# Patient Record
Sex: Male | Born: 1945
Health system: Southern US, Community
[De-identification: ages and names within clinical notes are randomized; demographics above are authoritative.]

## PROBLEM LIST (undated history)

## (undated) DIAGNOSIS — I10 Essential (primary) hypertension: Secondary | ICD-10-CM

## (undated) DIAGNOSIS — I7409 Other arterial embolism and thrombosis of abdominal aorta: Secondary | ICD-10-CM

## (undated) DIAGNOSIS — I739 Peripheral vascular disease, unspecified: Secondary | ICD-10-CM

## (undated) DIAGNOSIS — N179 Acute kidney failure, unspecified: Secondary | ICD-10-CM

## (undated) DIAGNOSIS — E039 Hypothyroidism, unspecified: Secondary | ICD-10-CM

## (undated) DIAGNOSIS — K315 Obstruction of duodenum: Secondary | ICD-10-CM

## (undated) DIAGNOSIS — K449 Diaphragmatic hernia without obstruction or gangrene: Secondary | ICD-10-CM

## (undated) DIAGNOSIS — K259 Gastric ulcer, unspecified as acute or chronic, without hemorrhage or perforation: Secondary | ICD-10-CM

## (undated) DIAGNOSIS — R0989 Other specified symptoms and signs involving the circulatory and respiratory systems: Secondary | ICD-10-CM

## (undated) DIAGNOSIS — K219 Gastro-esophageal reflux disease without esophagitis: Secondary | ICD-10-CM

## (undated) HISTORY — DX: Acute kidney failure, unspecified: N17.9

## (undated) HISTORY — DX: Gastro-esophageal reflux disease without esophagitis: K21.9

## (undated) HISTORY — DX: Other specified symptoms and signs involving the circulatory and respiratory systems: R09.89

## (undated) HISTORY — DX: Obstruction of duodenum: K31.5

## (undated) HISTORY — DX: Diaphragmatic hernia without obstruction or gangrene: K44.9

## (undated) HISTORY — DX: Hypothyroidism, unspecified: E03.9

## (undated) HISTORY — DX: Essential (primary) hypertension: I10

## (undated) HISTORY — DX: Gastric ulcer, unspecified as acute or chronic, without hemorrhage or perforation: K25.9

## (undated) HISTORY — DX: Peripheral vascular disease, unspecified: I73.9

## (undated) HISTORY — DX: Other arterial embolism and thrombosis of abdominal aorta: I74.09

## (undated) HISTORY — PX: LUMBAR FUSION: SHX111

---

## 2001-01-16 ENCOUNTER — Encounter: Payer: Self-pay | Admitting: *Deleted

## 2001-01-16 ENCOUNTER — Ambulatory Visit (HOSPITAL_COMMUNITY): Admission: RE | Admit: 2001-01-16 | Discharge: 2001-01-16 | Payer: Self-pay | Admitting: *Deleted

## 2002-08-13 ENCOUNTER — Ambulatory Visit: Admission: RE | Admit: 2002-08-13 | Discharge: 2002-08-13 | Payer: Self-pay | Admitting: *Deleted

## 2002-12-24 ENCOUNTER — Encounter: Payer: Self-pay | Admitting: *Deleted

## 2002-12-24 ENCOUNTER — Encounter: Admission: RE | Admit: 2002-12-24 | Discharge: 2002-12-24 | Payer: Self-pay | Admitting: *Deleted

## 2003-12-10 ENCOUNTER — Ambulatory Visit (HOSPITAL_COMMUNITY): Admission: RE | Admit: 2003-12-10 | Discharge: 2003-12-10 | Payer: Self-pay | Admitting: Neurosurgery

## 2004-02-19 ENCOUNTER — Encounter: Admission: RE | Admit: 2004-02-19 | Discharge: 2004-02-19 | Payer: Self-pay | Admitting: Neurosurgery

## 2004-03-11 ENCOUNTER — Encounter: Admission: RE | Admit: 2004-03-11 | Discharge: 2004-03-11 | Payer: Self-pay | Admitting: Neurosurgery

## 2004-03-28 IMAGING — RF DG MYELOGRAM LUMBAR
11 series · 11 of 11 positions shown · IV contrast (omnipaque)
Comparison: none

CLINICAL DATA: Back pain.  Bilateral hip and leg pain.  Spinal stenosis. 
 LUMBAR MYELOGRAM 
 Lumbar puncture and injection of Omnipaque was performed by Dr. Chai at the L5-S1 level.
 There is mild anterior slip of L-4 on L-5 of approximately 3 mm.  Disc spaces are well maintained.  There is no significant spinal stenosis.  There is no significant spinal stenosis.  There is no nerve root cut off.  There is mild disc bulge at L2-3, L3-4, and L4-5. 
 IMPRESSION
 Grade I anterior slip of L-4 on L-5 without significant spinal stenosis. 
 CT MYELOGRAM LUMBAR SPINE 
 Scanning was performed from L-2 through S-1 with multiplanar reconstruction. 
 L2-3:  There is mild disc bulging without stenosis. 
 L3-4:  There is mild disc bulging without significant spinal stenosis. 
 L4-5:   Grade I anterior slip is noted.  The disc bulges diffusely and there is a focal small disc protrusion laterally on the right involving the foramen and extraforaminal area.  This may involve the L-4 and L-5 nerve roots.  
 L5-S1:  Mild disc bulging without protrusion or stenosis. 
 Grade I anterior slip of L-4 on L-5.  There is disc bulging and facet arthropathy at L4-5.  There is a lateral disc protrusion on the right at L4-5.
 MULTIPLANAR RECONSTRUCTION 
 Multiplanar reformatted CT images were reconstructed from the axial CT data set.  These images were reviewed, and pertinent findings are included in the accompanying complete CT report.
 See complete CT report.

[Series 1: run · 1 of 1 slices shown (1 of 11)]
[im 1/1]
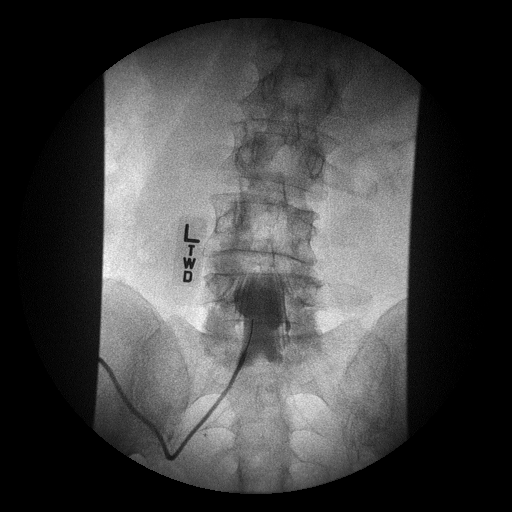

[Series 2: run · 1 of 1 slices shown (2 of 11)]
[im 1/1]
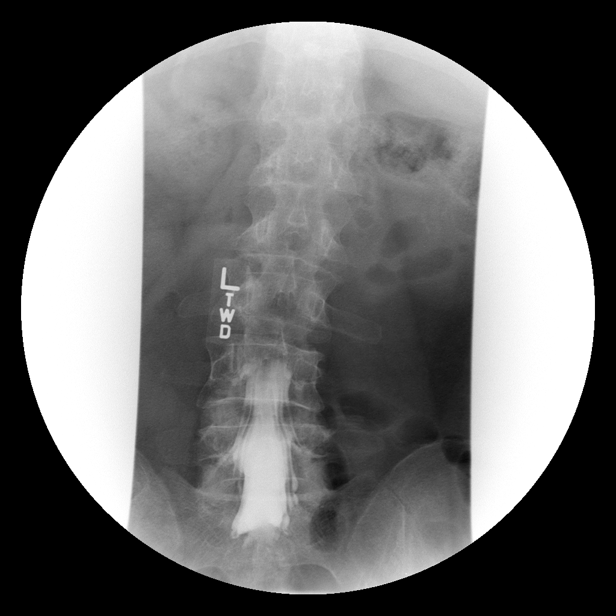

[Series 3: run · 1 of 1 slices shown (3 of 11)]
[im 1/1]
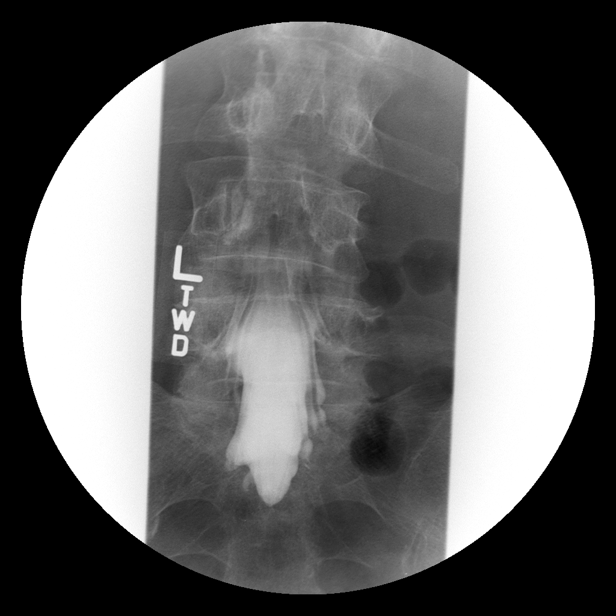

[Series 4: run · 1 of 1 slices shown (4 of 11)]
[im 1/1]
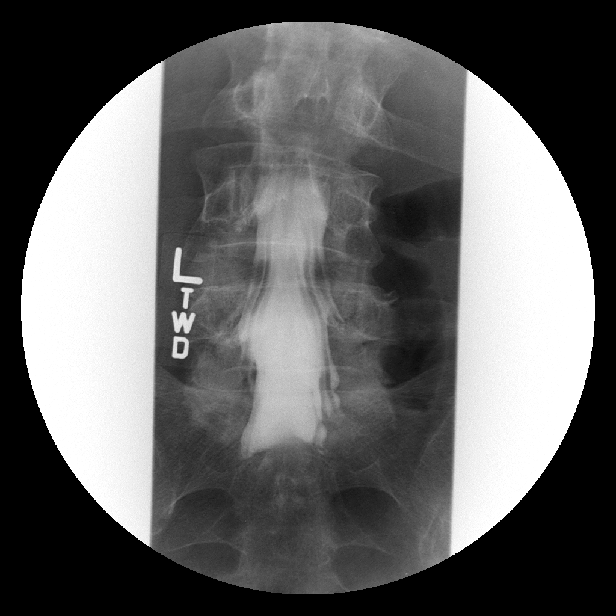

[Series 5: run · 1 of 1 slices shown (5 of 11)]
[im 1/1]
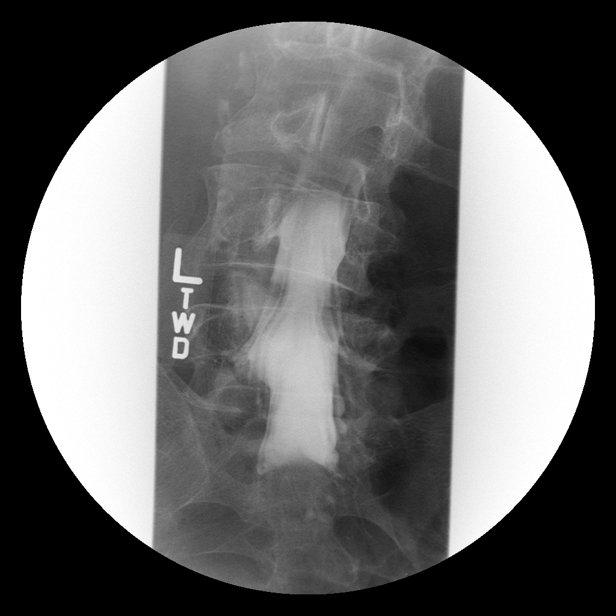

[Series 6: run · 1 of 1 slices shown (6 of 11)]
[im 1/1]
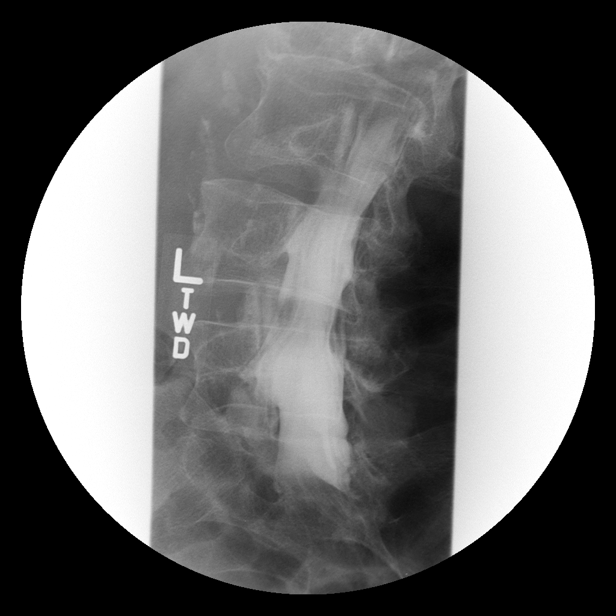

[Series 7: run · 1 of 1 slices shown (7 of 11)]
[im 1/1]
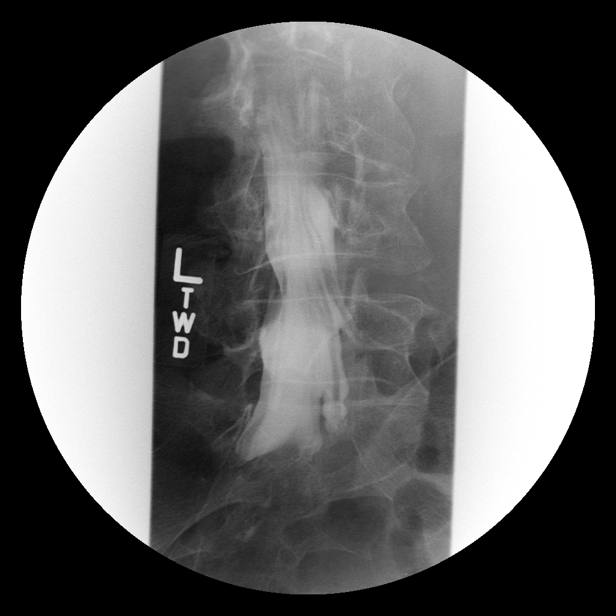

[Series 8: run · 1 of 1 slices shown (8 of 11)]
[im 1/1]
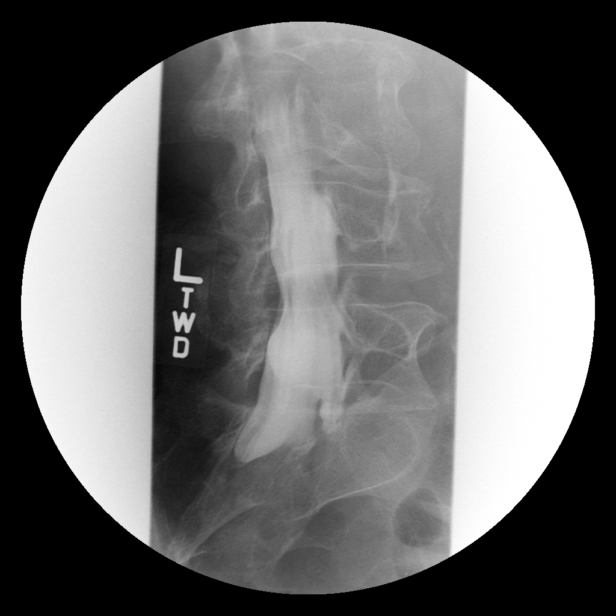

[Series 9: run · 1 of 1 slices shown (9 of 11)]
[im 1/1]
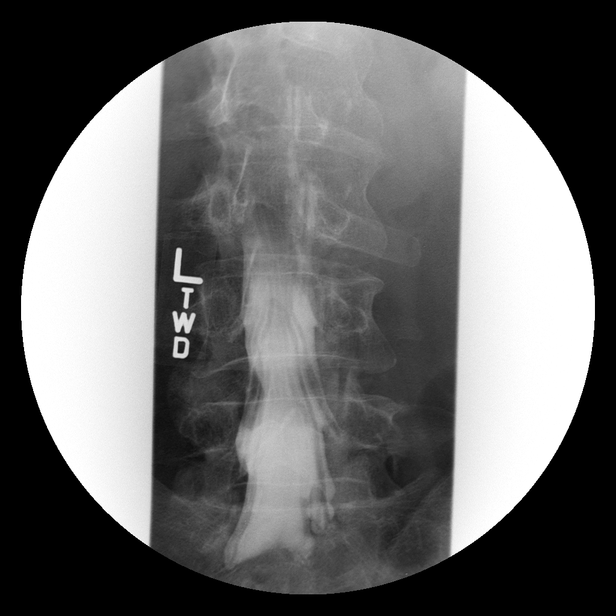

[Series 10: run · 1 of 1 slices shown (10 of 11)]
[im 1/1]
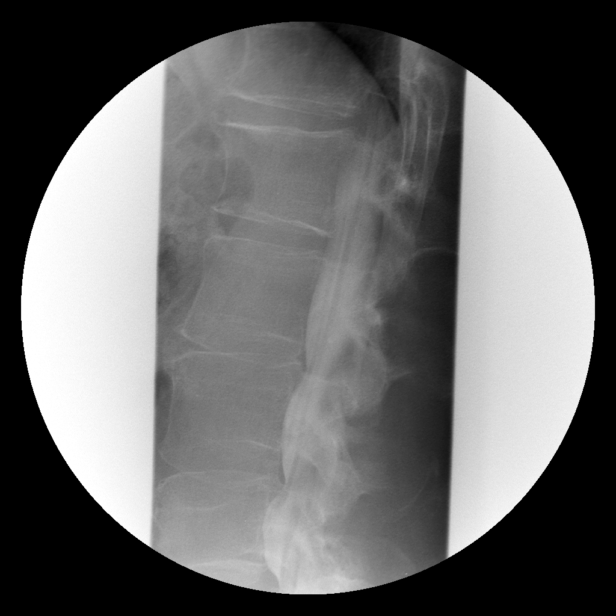

[Series 11: run · 1 of 1 slices shown (11 of 11)]
[im 1/1]
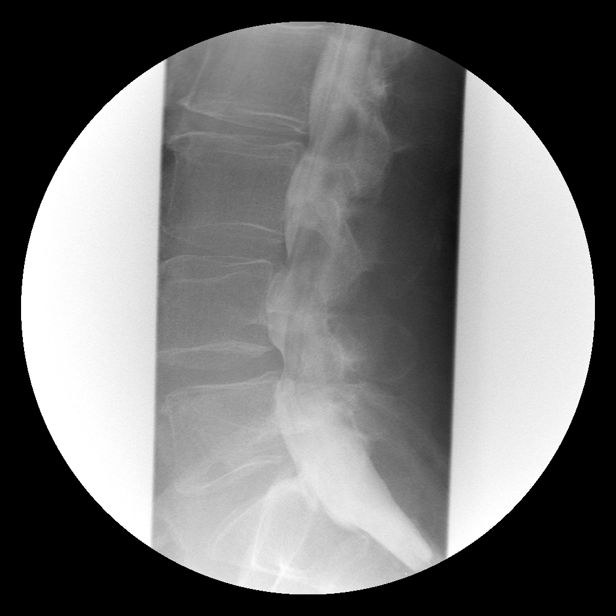

[11 of 11 positions shown; findings below may reference images not displayed]

## 2004-07-06 ENCOUNTER — Inpatient Hospital Stay (HOSPITAL_COMMUNITY): Admission: RE | Admit: 2004-07-06 | Discharge: 2004-07-09 | Payer: Self-pay | Admitting: Neurosurgery

## 2005-12-09 ENCOUNTER — Emergency Department (HOSPITAL_COMMUNITY): Admission: EM | Admit: 2005-12-09 | Discharge: 2005-12-10 | Payer: Self-pay | Admitting: Emergency Medicine

## 2007-01-10 ENCOUNTER — Ambulatory Visit (HOSPITAL_COMMUNITY): Admission: RE | Admit: 2007-01-10 | Discharge: 2007-01-10 | Payer: Self-pay | Admitting: Gastroenterology

## 2009-08-16 ENCOUNTER — Encounter: Admission: RE | Admit: 2009-08-16 | Discharge: 2009-08-16 | Payer: Self-pay | Admitting: Gastroenterology

## 2009-08-16 ENCOUNTER — Encounter: Payer: Self-pay | Admitting: Vascular Surgery

## 2009-08-16 ENCOUNTER — Ambulatory Visit: Payer: Self-pay | Admitting: Vascular Surgery

## 2009-08-16 ENCOUNTER — Inpatient Hospital Stay (HOSPITAL_COMMUNITY): Admission: EM | Admit: 2009-08-16 | Discharge: 2009-08-20 | Payer: Self-pay | Admitting: Emergency Medicine

## 2009-08-18 ENCOUNTER — Encounter (INDEPENDENT_AMBULATORY_CARE_PROVIDER_SITE_OTHER): Payer: Self-pay | Admitting: Internal Medicine

## 2009-09-08 ENCOUNTER — Ambulatory Visit (HOSPITAL_COMMUNITY): Admission: RE | Admit: 2009-09-08 | Discharge: 2009-09-08 | Payer: Self-pay | Admitting: Gastroenterology

## 2009-09-08 HISTORY — PX: OTHER SURGICAL HISTORY: SHX169

## 2009-09-15 ENCOUNTER — Ambulatory Visit: Payer: Self-pay | Admitting: Vascular Surgery

## 2009-10-27 ENCOUNTER — Encounter: Admission: RE | Admit: 2009-10-27 | Discharge: 2009-10-27 | Payer: Self-pay | Admitting: Gastroenterology

## 2009-10-27 ENCOUNTER — Ambulatory Visit: Payer: Self-pay | Admitting: Vascular Surgery

## 2009-11-03 ENCOUNTER — Ambulatory Visit (HOSPITAL_COMMUNITY): Admission: RE | Admit: 2009-11-03 | Discharge: 2009-11-03 | Payer: Self-pay | Admitting: Gastroenterology

## 2010-01-18 ENCOUNTER — Ambulatory Visit (HOSPITAL_COMMUNITY): Admission: RE | Admit: 2010-01-18 | Discharge: 2010-01-18 | Payer: Self-pay | Admitting: Gastroenterology

## 2010-10-23 ENCOUNTER — Encounter: Payer: Self-pay | Admitting: Vascular Surgery

## 2010-11-21 ENCOUNTER — Encounter: Payer: Self-pay | Admitting: Cardiovascular Disease

## 2010-11-21 DIAGNOSIS — K219 Gastro-esophageal reflux disease without esophagitis: Secondary | ICD-10-CM | POA: Insufficient documentation

## 2010-11-21 DIAGNOSIS — N179 Acute kidney failure, unspecified: Secondary | ICD-10-CM | POA: Insufficient documentation

## 2010-11-21 DIAGNOSIS — K259 Gastric ulcer, unspecified as acute or chronic, without hemorrhage or perforation: Secondary | ICD-10-CM | POA: Insufficient documentation

## 2010-11-21 DIAGNOSIS — K315 Obstruction of duodenum: Secondary | ICD-10-CM | POA: Insufficient documentation

## 2010-11-21 DIAGNOSIS — I10 Essential (primary) hypertension: Secondary | ICD-10-CM | POA: Insufficient documentation

## 2010-11-21 DIAGNOSIS — R0989 Other specified symptoms and signs involving the circulatory and respiratory systems: Secondary | ICD-10-CM | POA: Insufficient documentation

## 2010-11-21 DIAGNOSIS — K449 Diaphragmatic hernia without obstruction or gangrene: Secondary | ICD-10-CM | POA: Insufficient documentation

## 2010-11-21 DIAGNOSIS — E039 Hypothyroidism, unspecified: Secondary | ICD-10-CM | POA: Insufficient documentation

## 2010-11-21 DIAGNOSIS — I739 Peripheral vascular disease, unspecified: Secondary | ICD-10-CM | POA: Insufficient documentation

## 2010-11-21 DIAGNOSIS — I7409 Other arterial embolism and thrombosis of abdominal aorta: Secondary | ICD-10-CM | POA: Insufficient documentation

## 2010-12-29 ENCOUNTER — Ambulatory Visit: Payer: Self-pay | Admitting: Cardiovascular Disease

## 2011-01-04 LAB — BASIC METABOLIC PANEL
BUN: 22 mg/dL (ref 6–23)
CO2: 25 mEq/L (ref 19–32)
Calcium: 8.4 mg/dL (ref 8.4–10.5)
Calcium: 8.6 mg/dL (ref 8.4–10.5)
Chloride: 99 mEq/L (ref 96–112)
Creatinine, Ser: 1.14 mg/dL (ref 0.4–1.5)
Glucose, Bld: 113 mg/dL — ABNORMAL HIGH (ref 70–99)

## 2011-01-04 LAB — CBC
HCT: 30.9 % — ABNORMAL LOW (ref 39.0–52.0)
HCT: 32.3 % — ABNORMAL LOW (ref 39.0–52.0)
Hemoglobin: 10.6 g/dL — ABNORMAL LOW (ref 13.0–17.0)
Hemoglobin: 11.1 g/dL — ABNORMAL LOW (ref 13.0–17.0)
Hemoglobin: 14.5 g/dL (ref 13.0–17.0)
MCV: 94.2 fL (ref 78.0–100.0)
RBC: 3.28 MIL/uL — ABNORMAL LOW (ref 4.22–5.81)
RBC: 3.44 MIL/uL — ABNORMAL LOW (ref 4.22–5.81)
RBC: 4.54 MIL/uL (ref 4.22–5.81)
RDW: 14.4 % (ref 11.5–15.5)
WBC: 5 10*3/uL (ref 4.0–10.5)
WBC: 6.2 10*3/uL (ref 4.0–10.5)
WBC: 9.1 10*3/uL (ref 4.0–10.5)

## 2011-01-04 LAB — DIFFERENTIAL
Lymphocytes Relative: 21 % (ref 12–46)
Lymphs Abs: 1.9 10*3/uL (ref 0.7–4.0)
Monocytes Absolute: 0.9 10*3/uL (ref 0.1–1.0)
Monocytes Relative: 10 % (ref 3–12)
Neutro Abs: 6.2 10*3/uL (ref 1.7–7.7)
Neutrophils Relative %: 68 % (ref 43–77)

## 2011-01-04 LAB — POCT I-STAT, CHEM 8
Calcium, Ion: 1.06 mmol/L — ABNORMAL LOW (ref 1.12–1.32)
Chloride: 87 mEq/L — ABNORMAL LOW (ref 96–112)
HCT: 45 % (ref 39.0–52.0)
Potassium: 3 mEq/L — ABNORMAL LOW (ref 3.5–5.1)
Sodium: 136 mEq/L (ref 135–145)

## 2011-01-04 LAB — HEPATIC FUNCTION PANEL
ALT: 14 U/L (ref 0–53)
AST: 24 U/L (ref 0–37)
Albumin: 4.4 g/dL (ref 3.5–5.2)
Alkaline Phosphatase: 76 U/L (ref 39–117)
Total Bilirubin: 0.7 mg/dL (ref 0.3–1.2)

## 2011-01-04 LAB — TYPE AND SCREEN

## 2011-01-04 LAB — ABO/RH: ABO/RH(D): A POS

## 2011-01-04 LAB — MAGNESIUM: Magnesium: 2.1 mg/dL (ref 1.5–2.5)

## 2011-01-04 LAB — LACTIC ACID, PLASMA: Lactic Acid, Venous: 2.5 mmol/L — ABNORMAL HIGH (ref 0.5–2.2)

## 2011-02-14 NOTE — Assessment & Plan Note (Signed)
OFFICE VISIT   SHI, BLANKENSHIP A  DOB:  1945-12-31                                       10/27/2009  CHART#:[id number]   The patient returns for followup today with complaints of lower  extremity weakness.  He was previously seen as an inpatient hospital  consult at which time he was found to have an aortic occlusion.  At the  time he had a duodenal outlet obstruction and was treated for that.  He  was last seen in December of 2010 and was starting to regain some weight  after endoscopic dilatation of his duodenum.  The patient currently  develops fatigue primarily in the groin and lower back with walking long  distances.  He also has fatigue in the lower extremities after walking  approximately 100 yards.  He has continued to gain weight since he left  the hospital and also since December.  He is eating well with no  problems with nausea or vomiting or keeping down food.  He has continued  to refrain from smoking.   REVIEW OF SYSTEMS:  He has no shortness of breath and no chest pain.  He  recently saw Dr. Elease Hashimoto and had a stress test which he reported was  normal.   PHYSICAL EXAM:  Vital signs:  Blood pressure is 130/72 in the right arm,  heart rate 69 and regular.  Temperature is 98.5.  Lower extremities:  He  has absent femoral, popliteal and pedal pulses bilaterally.  Feet are  cool bilaterally but overall well-perfused with no evidence of  ulceration and no evidence of frank ischemia.  He had bilateral ABIs  performed today which were 0.58 on the right, 0.66 on the left.   I had a lengthy discussion with the patient today regarding  aortobifemoral bypass grafting.  He states that currently he has  erectile dysfunction and I discussed with him that this would probably  not be improved by the aortobifemoral bypass graft but certainly could  be related to his aortic occlusion.  Additionally, risks, benefits,  possible complications and procedure details  were discussed with the  patient today.  He agrees to proceed with aortobifemoral bypass grafting  but apparently has a busy time at work right now and wishes to defer  this until the spring.  He will return in May of 2011 to review his  situation to see if he still wishes to proceed.  He will call sooner if  he wishes to proceed prior to this.  At that time we will schedule him  for a repeat CT angiogram since his old CT will be several months old at  that point.  We will also perform bilateral ABIs on him at that time to  reassess the circulation in his lower extremities and we will also do a  carotid duplex exam as part of our preoperative workup for his aortic  procedure.     Janetta Hora. Fields, MD  Electronically Signed   CEF/MEDQ  D:  10/27/2009  T:  10/28/2009  Job:  [id number]   cc:   Shirley Friar, MD  Vesta Mixer, M.D.  Holley Bouche, M.D.

## 2011-02-14 NOTE — Assessment & Plan Note (Signed)
OFFICE VISIT   Nicholas Nicholas [name], Nicholas Nicholas [name]  DOB:  10/16/1945                                       09/15/2009  CHART#:[id number]   CHIEF COMPLAINT:  Leg weakness.   HISTORY OF PRESENT ILLNESS:  The patient is Nicholas [name] 65 year old male who I saw  as an in hospital consult in mid November for aortic occlusion.  At that  time he also had Nicholas [name] duodenal stricture and underwent an endoscopic  dilation of his duodenum on 08/18/2009.  He also had multiple pyloric  channel ulcers.   As far as his aortic occlusion is concerned the patient currently has  pain in his groin if he tries to go uphill.  He also develops fatigue in  both lower extremities after walking approximately 100 yards.  He states  that he has gained some weight since leaving the hospital today and  states his weight is currently 147 pounds.  He denies any abdominal  pain.  He states that he has been able to eat at this point and feels  well overall.  The lower extremity fatigue is fairly chronic in nature  and has been going on for several months.   He is Nicholas [name] former smoker and quit smoking in March of 2009.   PAST SURGICAL HISTORY:  Lumbar fusion.   REVIEW OF SYSTEMS:  He denies shortness of breath or chest pain.   Primary atherosclerotic risk factor and chronic medical problem is  hypertension which is currently controlled.   PHYSICAL EXAM:  Today blood pressure is 126/73 in the left arm, heart  rate 74 and regular, respirations 20, weight is 149.9 pounds on our  scale at the office.  Upper extremities:  Has 2+ brachial and radial  pulses bilaterally.  He has absent femoral, popliteal and pedal pulses  bilaterally.  He has no ulcerations on the feet.  He has no significant  lower extremity edema.  Abdomen is soft, nontender, nondistended.  No  masses.   MEDICATIONS:  1. Were reviewed from his previous discharge summary and these      currently include amlodipine 5 mg once Nicholas [name] day.  2. Vitamin B12 1 tablet once Nicholas [name]  day.  3. Fish oil 1 capsule once Nicholas [name] day.  4. Calcium/vitamin D 1 tablet daily.  5. Lisinopril/hydrochlorothiazide 12/25 once daily.  6. Levoxyl 125 mcg once Nicholas [name] day.  7. Protonix 40 mg twice Nicholas [name] day.  8. Promethazine 25 mg p.r.n. for nausea.   Overall the patient seems to have recovered from his duodenal  obstruction at this time.  He will follow up with me in 1 month's time.  If he continues to gain weight and becomes overall nutritionally more  well balanced will consider aortobifemoral bypass at that time.     Janetta Hora. Fields, MD  Electronically Signed   CEF/MEDQ  D:  09/17/2009  T:  09/17/2009  Job:  [id number]   cc:   Dr Henderson [place]  Shirley Friar, MD

## 2011-02-17 NOTE — Op Note (Signed)
Nicholas [name], Nicholas [name]               ACCOUNT NO.:  [id number]   MEDICAL RECORD NO.:  [id number]          PATIENT TYPE:  INP   LOCATION:  3013                         FACILITY:  [facility]   PHYSICIAN:  Cristi Loron, M.D.DATE OF BIRTH:  1945-12-08   DATE OF PROCEDURE:  07/06/2004  DATE OF DISCHARGE:                                 OPERATIVE REPORT   PREOPERATIVE DIAGNOSES:  L4-5 acquired spondylolisthesis and spinal  stenosis, degenerative disk disease, lumbago, lumbar radiculopathy.   POSTOPERATIVE DIAGNOSES:  L4-5 acquired spondylolisthesis and spinal  stenosis, degenerative disk disease, lumbago, lumbar radiculopathy.   PROCEDURE:  L4 Gill procedure; L4-5 posterior lumbar interbody fusion;  placement  of bilateral interbody fusion; posterior non-segmental instrumentation with  Legacy titanium pedicle screws and rods; posterolateral arthrodesis with  local, morcellized autograft bone and Vitoss bone scaffolding.   SURGEON:  Cristi Loron, M.D.   ASSISTANT:  Clydene Fake, M.D.   ANESTHESIA:  General endotracheal.   ESTIMATED BLOOD LOSS:  150 cc.   SPECIMENS:  None.   DRAINS:  None.   COMPLICATIONS:  None.   BRIEF HISTORY:  The patient is a 65 year old white male who had suffered  from back and leg pain.  He had failed medical management, was worked-up  with a lumbar MRI which demonstrated L4-5 spondylolisthesis and spinal  stenosis.   I discussed the various treatment options with him, including surgery.  The  patient weighed the risks, benefits and alternatives of surgery and decided  to proceed with an L4-5  decompression and fusion.   DESCRIPTION OF PROCEDURE:  The patient was brought to the operating room by  the anesthesia team. General endotracheal anesthesia was induced.  The  patient was then turned to the prone position on the Wilson frame, his  lumbosacral region was then prepared with Betadine scrub and Betadine  solution.  Sterile drapes were  applied.  I then injected the intravenous  sites with Marcaine with epinephrine solution.   I used a scalpel to make a linear, midline incision over the L4-5  interspace.  I used electrocautery to dissect down to thoracolumbar fascia,  and then performed a bilateral subperiosteal dissection, exposing  the spinous processes, lamina and facets of L4 and L5. We obtained an  intraoperative radiograph to confirm our location.   I then inserted the McCullough retractor for exposure, and then used the  scalpel to incise the L3-4 and L4-5 interspinous ligament.  We then used  Leksell rongeur to remove the L4 spinous process and part of the L4 lamina.  This bone was saved and cleared it of soft tissue, and later used it in the  infusion process.  We then used a high speed drill to perform bilateral L4  laminotomies.  We then completed the L4 laminectomy and Gill procedure,  decompressing the thecal sac and performing foraminotomies about the  bilateral L4 and L5 nerve roots, completing the decompression.   We now freed up the thecal sac and nerve roots from the epidural tissue and  then carefully retracted them medially with the D'Errico retractor, exposing  the L4-5 intervertebral  disk.  We incised the intervertebral disk,  and performed an aggressive diskectomy using  Epstein curets and the  pituitaries, and Scovill curets.  We did this bilaterally.   We then prepared the vertebral endplates by using the crescent to clear off  the soft tissue. We then distracted the vertebral endplates with interbody  spreader and then inserted a 14 x 26 mm PEEK capstone cage, which had been  prefilled with local, morcellized autograft bone and  Vitoss bone scaffolding, into the distracted L4-5 interspace.   We removed the distractor from the contralateral side and then placed  another capstone cage in that side, after retracting the neural structures  out of harms' way.  We filled in the cages with a  combination of local,  morcellized autograft bone and Vitoss bone scaffolding, completing the  posterior lumbar interbody fusion.   We now turned our attention to the posterior, nonsegmental instrumentation.  We used electrocautery to expose the bilateral transverse processes of L4  and L5.  Then, under fluoroscopic guidance,  we cannulated the bilateral L4  and L5 pedicles. We then tapped the pedicles, and then inserted 6.5 x 50 mm  pedicle screws bilaterally at L4 and L5.  We then palpated along the medial  aspect of the L4 and L5 pedicles and noted that there were no cortical  breaches, and that the  L4-L5 nerve roots were not injured.  We then connected the unilateral  pedicle screws with a lordotic rod, compressed the construct and then  secured the rod in place with the caps, which were torqued appropriately.  This completed the instrumentation.   We now turned our attention to the posterolateral arthrodesis.  We used the  high speed drill to decorticate the bilateral transverse processes at L4-L5,  the remainder of the L4-5 facet, and part of the L5 lamina. We then laid a  combination of local and morcellized autograft bone, and Vitoss bone  scaffolding over the decorticated posterolateral structures, completing the  posterolateral arthrodesis.   We then inspected the thecal sac and the bilateral L4-L5 nerve roots and  noted they were well decompressed.  We obtained strict hemostasis using  bipolar electrocautery. We removed the Versa-Trac retractor.  We  reapproximated the patient's back and lumbar fascia with interrupted #1  Vicryl suture, subcutaneous tissue using interrupted 2-0 Vicryl suture and  skin with Steri-Strips and Benzoin. The wound was then coated with  Bacitracin ointment and  sterile dressing was applied. The drapes were removed.  The patient was  subsequently returned to the supine position, where he was extubated by the anesthesia team and transported to the  postanesthesia care unit in stable  condition. All sponge, instrument and needle counts were correct at the end  of this case.       JDJ/MEDQ  D:  07/07/2004  T:  07/07/2004  Job:  [id number]

## 2011-02-17 NOTE — Discharge Summary (Signed)
NAMEKORVIN, [name]               ACCOUNT NO.:  [id number]   MEDICAL RECORD NO.:  [id number]          PATIENT TYPE:  INP   LOCATION:  3013                         FACILITY:  [facility]   PHYSICIAN:  Coletta Memos, M.D.     DATE OF BIRTH:  [birth year]   DATE OF ADMISSION:  07/06/2004  DATE OF DISCHARGE:  07/09/2004                                 DISCHARGE SUMMARY   ADMISSION DIAGNOSES:  1.  L4-5 degenerative disk disease.  2.  L4-5 spondylosis.  3.  Lumbar radiculopathy.  4.  L4-5 spondylolisthesis and lumbar stenosis.   DISCHARGE DIAGNOSES:  1.  L4-5 degenerative disk disease.  2.  L4-5 spondylosis.  3.  Lumbar radiculopathy.  4.  L4-5 spondylolisthesis and lumbar stenosis.   PROCEDURE:  L4-5 PLIF, posterolateral arthrodesis, pedicle screw fixation  nonsegmental, and decompression.   COMPLICATIONS:  None.   DISCHARGE STATUS:  Alive and well.  Incision clean and dry without signs of  infection.   MEDICATIONS:  Percocet and Valium.   DESTINATION:  To home.   STRENGTH:  Full at discharge.   [name] was admitted for Dr. Lovell Sheehan to perform a lumbar decompression  and stabilization after he was found to have lumbar stenosis secondary to  spondylolisthesis.  He was taken to the operating room, had an uncomplicated  procedure.  Postoperatively, he did quite well.  He was given an instruction  sheet per Dr. Lovell Sheehan with regard to his activities.  He was discharged home  in excellent condition.       KC/MEDQ  D:  09/30/2004  T:  09/30/2004  Job:  [id number]

## 2011-03-09 ENCOUNTER — Encounter: Payer: Self-pay | Admitting: Cardiovascular Disease

## 2012-10-07 ENCOUNTER — Encounter: Payer: Self-pay | Admitting: Vascular Surgery

## 2014-12-06 DIAGNOSIS — S51852A Open bite of left forearm, initial encounter: Secondary | ICD-10-CM | POA: Diagnosis not present

## 2019-08-20 DIAGNOSIS — J069 Acute upper respiratory infection, unspecified: Secondary | ICD-10-CM | POA: Diagnosis not present

## 2019-08-22 ENCOUNTER — Other Ambulatory Visit: Payer: Self-pay

## 2019-08-22 DIAGNOSIS — Z20822 Contact with and (suspected) exposure to covid-19: Secondary | ICD-10-CM

## 2019-08-25 LAB — NOVEL CORONAVIRUS, NAA: SARS-CoV-2, NAA: DETECTED — AB

## 2019-08-27 ENCOUNTER — Inpatient Hospital Stay (HOSPITAL_COMMUNITY)
Admission: EM | Admit: 2019-08-27 | Discharge: 2019-09-02 | DRG: 177 | Disposition: A | Discharge status: Home / Self Care | Payer: Medicare HMO | Attending: Internal Medicine | Admitting: Internal Medicine

## 2019-08-27 ENCOUNTER — Other Ambulatory Visit: Payer: Self-pay

## 2019-08-27 ENCOUNTER — Emergency Department (HOSPITAL_COMMUNITY): Payer: Medicare HMO

## 2019-08-27 ENCOUNTER — Encounter (HOSPITAL_COMMUNITY): Payer: Self-pay

## 2019-08-27 DIAGNOSIS — Z808 Family history of malignant neoplasm of other organs or systems: Secondary | ICD-10-CM | POA: Diagnosis not present

## 2019-08-27 DIAGNOSIS — Z87891 Personal history of nicotine dependence: Secondary | ICD-10-CM

## 2019-08-27 DIAGNOSIS — Z79899 Other long term (current) drug therapy: Secondary | ICD-10-CM

## 2019-08-27 DIAGNOSIS — R7612 Nonspecific reaction to cell mediated immunity measurement of gamma interferon antigen response without active tuberculosis: Secondary | ICD-10-CM | POA: Diagnosis not present

## 2019-08-27 DIAGNOSIS — K449 Diaphragmatic hernia without obstruction or gangrene: Secondary | ICD-10-CM | POA: Diagnosis present

## 2019-08-27 DIAGNOSIS — Z9981 Dependence on supplemental oxygen: Secondary | ICD-10-CM | POA: Diagnosis not present

## 2019-08-27 DIAGNOSIS — E039 Hypothyroidism, unspecified: Secondary | ICD-10-CM | POA: Diagnosis present

## 2019-08-27 DIAGNOSIS — Z79890 Hormone replacement therapy (postmenopausal): Secondary | ICD-10-CM | POA: Diagnosis not present

## 2019-08-27 DIAGNOSIS — E861 Hypovolemia: Secondary | ICD-10-CM | POA: Diagnosis present

## 2019-08-27 DIAGNOSIS — Z8249 Family history of ischemic heart disease and other diseases of the circulatory system: Secondary | ICD-10-CM

## 2019-08-27 DIAGNOSIS — U071 COVID-19: Secondary | ICD-10-CM | POA: Diagnosis not present

## 2019-08-27 DIAGNOSIS — J1289 Other viral pneumonia: Secondary | ICD-10-CM | POA: Diagnosis present

## 2019-08-27 DIAGNOSIS — I739 Peripheral vascular disease, unspecified: Secondary | ICD-10-CM | POA: Diagnosis present

## 2019-08-27 DIAGNOSIS — I2699 Other pulmonary embolism without acute cor pulmonale: Secondary | ICD-10-CM | POA: Diagnosis not present

## 2019-08-27 DIAGNOSIS — N179 Acute kidney failure, unspecified: Secondary | ICD-10-CM | POA: Diagnosis present

## 2019-08-27 DIAGNOSIS — J9601 Acute respiratory failure with hypoxia: Secondary | ICD-10-CM | POA: Diagnosis not present

## 2019-08-27 DIAGNOSIS — K219 Gastro-esophageal reflux disease without esophagitis: Secondary | ICD-10-CM | POA: Diagnosis present

## 2019-08-27 DIAGNOSIS — N189 Chronic kidney disease, unspecified: Secondary | ICD-10-CM | POA: Diagnosis not present

## 2019-08-27 DIAGNOSIS — I10 Essential (primary) hypertension: Secondary | ICD-10-CM | POA: Diagnosis present

## 2019-08-27 DIAGNOSIS — I7409 Other arterial embolism and thrombosis of abdominal aorta: Secondary | ICD-10-CM | POA: Diagnosis present

## 2019-08-27 DIAGNOSIS — Z885 Allergy status to narcotic agent status: Secondary | ICD-10-CM | POA: Diagnosis not present

## 2019-08-27 DIAGNOSIS — R0602 Shortness of breath: Secondary | ICD-10-CM | POA: Diagnosis not present

## 2019-08-27 LAB — COMPREHENSIVE METABOLIC PANEL
ALT: 28 U/L (ref 0–44)
AST: 44 U/L — ABNORMAL HIGH (ref 15–41)
Albumin: 3.4 g/dL — ABNORMAL LOW (ref 3.5–5.0)
Alkaline Phosphatase: 59 U/L (ref 38–126)
Anion gap: 14 (ref 5–15)
BUN: 58 mg/dL — ABNORMAL HIGH (ref 8–23)
CO2: 25 mmol/L (ref 22–32)
Calcium: 9.2 mg/dL (ref 8.9–10.3)
Chloride: 97 mmol/L — ABNORMAL LOW (ref 98–111)
Creatinine, Ser: 1.99 mg/dL — ABNORMAL HIGH (ref 0.61–1.24)
GFR calc Af Amer: 37 mL/min — ABNORMAL LOW (ref >60)
GFR calc non Af Amer: 32 mL/min — ABNORMAL LOW (ref >60)
Glucose, Bld: 140 mg/dL — ABNORMAL HIGH (ref 70–99)
Potassium: 3.8 mmol/L (ref 3.5–5.1)
Sodium: 136 mmol/L (ref 135–145)
Total Bilirubin: 0.4 mg/dL (ref 0.3–1.2)
Total Protein: 8 g/dL (ref 6.5–8.1)

## 2019-08-27 LAB — CBC WITH DIFFERENTIAL/PLATELET
Abs Immature Granulocytes: 0.07 10*3/uL (ref 0.00–0.07)
Basophils Absolute: 0 10*3/uL (ref 0.0–0.1)
Basophils Relative: 0 %
Eosinophils Absolute: 0 10*3/uL (ref 0.0–0.5)
Eosinophils Relative: 0 %
HCT: 45.5 % (ref 39.0–52.0)
Hemoglobin: 15 g/dL (ref 13.0–17.0)
Immature Granulocytes: 1 %
Lymphocytes Relative: 7 %
Lymphs Abs: 0.6 10*3/uL — ABNORMAL LOW (ref 0.7–4.0)
MCH: 30.5 pg (ref 26.0–34.0)
MCHC: 33 g/dL (ref 30.0–36.0)
MCV: 92.5 fL (ref 80.0–100.0)
Monocytes Absolute: 0.4 10*3/uL (ref 0.1–1.0)
Monocytes Relative: 5 %
Neutro Abs: 7.5 10*3/uL (ref 1.7–7.7)
Neutrophils Relative %: 87 %
Platelets: 296 10*3/uL (ref 150–400)
RBC: 4.92 MIL/uL (ref 4.22–5.81)
RDW: 13.2 % (ref 11.5–15.5)
WBC: 8.5 10*3/uL (ref 4.0–10.5)
nRBC: 0 % (ref 0.0–0.2)

## 2019-08-27 LAB — URINALYSIS, ROUTINE W REFLEX MICROSCOPIC
Bilirubin Urine: NEGATIVE
Glucose, UA: NEGATIVE mg/dL
Ketones, ur: 5 mg/dL — AB
Leukocytes,Ua: NEGATIVE
Nitrite: NEGATIVE
Protein, ur: NEGATIVE mg/dL
Specific Gravity, Urine: 1.015 (ref 1.005–1.030)
pH: 5 (ref 5.0–8.0)

## 2019-08-27 MED ORDER — SODIUM CHLORIDE 0.9 % IV SOLN
200.0000 mg | Freq: Once | INTRAVENOUS | Status: AC
  Administered 2019-08-27: 200 mg via INTRAVENOUS
  Filled 2019-08-27: qty 40

## 2019-08-27 MED ORDER — SODIUM CHLORIDE 0.9 % IV SOLN
100.0000 mg | Freq: Every day | INTRAVENOUS | Status: AC
  Administered 2019-08-28 – 2019-08-31 (×4): 100 mg via INTRAVENOUS
  Filled 2019-08-27 (×4): qty 100

## 2019-08-27 MED ORDER — SODIUM CHLORIDE 0.9 % IV BOLUS
1000.0000 mL | Freq: Once | INTRAVENOUS | Status: AC
  Administered 2019-08-27: 19:00:00 1000 mL via INTRAVENOUS

## 2019-08-27 NOTE — ED Triage Notes (Addendum)
Pt arrived POV ambulatory into ED with CC COVID positive on 11/20 exposure on 11/11 "at a wedding". Pt presents with weakness, loss of taste and smell, SOB.  Hx COPD

## 2019-08-27 NOTE — ED Notes (Signed)
This RN assisted pt to use urinal to void. 100 ml straw yellow no odor

## 2019-08-27 NOTE — ED Provider Notes (Signed)
[facility] DEPT Provider Note   CSN: [id number] Arrival date & time: 08/27/19  1721     History   Chief Complaint Chief Complaint  Patient presents with  . COVID  . Shortness of Breath    HPI Nicholas [name] is a 73 y.o. male.     HPI  73 year old male presents with generalized weakness/fatigue.  He has been feeling poorly for about a week.  Has lack of taste/smell and poor appetite. Tested positive for the novel coronavirus a few days ago.  The patient denies any fevers.  Has had some cough but no shortness of breath.  He most notices the weakness whenever he gets up to walk.  It is generalized and nonfocal.  He denies chest pain, vomiting, diarrhea.  He denies any leg swelling.  Past Medical History:  Diagnosis Date  . Acute renal failure ([place])   . Carotid bruit   . Claudication ([place])   . Duodenal stricture   . GERD (gastroesophageal reflux disease)   . Hiatal hernia   . Hypertension   . Hypothyroidism   . Leriche's syndrome ([place])   . PVD (peripheral vascular disease) ([facility])    KNOWN AORTIC OCCLUSION  . Pyloric ulcer     Patient Active Problem List   Diagnosis Date Noted  . PVD (peripheral vascular disease) ([place])   . Carotid bruit   . Claudication ([place])   . Duodenal stricture   . Hypertension   . Pyloric ulcer   . Hiatal hernia   . Hypothyroidism   . GERD (gastroesophageal reflux disease)   . Leriche's syndrome ([place])   . Acute renal failure ([place])           Home Medications    Prior to Admission medications   Medication Sig Start Date End Date Taking? Authorizing Provider  amLODipine (NORVASC) 5 MG tablet Take 5 mg by mouth daily.     Yes [provider]  chlorpheniramine-HYDROcodone (TUSSIONEX) 10-8 MG/5ML SUER Take 5 mLs by mouth every 12 (twelve) hours as needed for cough. 08/20/19  Yes [provider]  cholecalciferol (VITAMIN D3) 25 MCG (1000 UT) tablet Take 2,000 Units by mouth daily.   Yes  [provider]  fish oil-omega-3 fatty acids 1000 MG capsule Take 1 g by mouth daily.     Yes [provider]  ipratropium (ATROVENT) 0.06 % nasal spray Place 2 sprays into both nostrils 4 (four) times daily. 08/20/19  Yes [provider]  levothyroxine (SYNTHROID) 137 MCG tablet Take 137 mcg by mouth daily before breakfast.   Yes [provider]  lisinopril-hydrochlorothiazide (PRINZIDE,ZESTORETIC) 20-12.5 MG per tablet Take 2 tablets by mouth daily.     Yes [provider]  pantoprazole (PROTONIX) 40 MG tablet Take 40 mg by mouth daily.     Yes [provider]  Calcium Carbonate-Vitamin D (CALCIUM + D PO) Take by mouth daily.      [provider]    Family History Family History  Problem Relation Age of Onset  . Other Father        CHOKED  . Other Mother        BLOOD POISONING  . Coronary artery disease Mother        CABG x5 LATE 70's  . Thyroid cancer Daughter        THYROIDECTOMY    Social History Social History   Tobacco Use  . Smoking status: Former Smoker  Substance Use Topics  . Alcohol  use: Not on file  . Drug use: Not on file     Allergies   Morphine and related   Review of Systems Review of Systems  Constitutional: Positive for fatigue. Negative for fever.  Respiratory: Positive for cough. Negative for shortness of breath.   Cardiovascular: Negative for chest pain and leg swelling.  Gastrointestinal: Negative for abdominal pain, diarrhea and vomiting.  Neurological: Positive for weakness. Negative for headaches.  All other systems reviewed and are negative.    Physical Exam Updated Vital Signs BP (!) 154/75   Pulse 88   Temp 98.1 F (36.7 C)   Resp (!) 22   SpO2 94%   Physical Exam Vitals signs and nursing note reviewed.  Constitutional:      Appearance: He is well-developed.  HENT:     Head: Normocephalic and atraumatic.     Right Ear: External ear normal.     Left Ear: External  ear normal.     Nose: Nose normal.  Eyes:     General:        Right eye: No discharge.        Left eye: No discharge.  Neck:     Musculoskeletal: Neck supple.  Cardiovascular:     Rate and Rhythm: Normal rate and regular rhythm.     Heart sounds: Normal heart sounds.  Pulmonary:     Effort: Pulmonary effort is normal.     Breath sounds: Rales (mild, bibasilar) present.  Abdominal:     Palpations: Abdomen is soft.     Tenderness: There is no abdominal tenderness.  Musculoskeletal:     Right lower leg: No edema.     Left lower leg: No edema.  Skin:    General: Skin is warm and dry.  Neurological:     Mental Status: He is alert and oriented to person, place, and time.     Comments: 5/5 strength in all 4 extremities.   Psychiatric:        Mood and Affect: Mood is not anxious.      ED Treatments / Results  Labs (all labs ordered are listed, but only abnormal results are displayed) Labs Reviewed  COMPREHENSIVE METABOLIC PANEL - Abnormal; Notable for the following components:      Result Value   Chloride 97 (*)    Glucose, Bld 140 (*)    BUN 58 (*)    Creatinine, Ser 1.99 (*)    Albumin 3.4 (*)    AST 44 (*)    GFR calc non Af Amer 32 (*)    GFR calc Af Amer 37 (*)    All other components within normal limits  CBC WITH DIFFERENTIAL/PLATELET - Abnormal; Notable for the following components:   Lymphs Abs 0.6 (*)    All other components within normal limits    EKG None  Radiology Dg Chest Portable 1 View  Result Date: 08/27/2019 CLINICAL DATA:  Shortness of breath, COVID positive EXAM: PORTABLE CHEST 1 VIEW COMPARISON:  08/16/2009 FINDINGS: Left upper lobe and bilateral lower lobe opacities, compatible with pneumonia in this patient with known COVID. Possible small left pleural effusion. No pneumothorax. The heart is top-normal in size.  Thoracic aortic atherosclerosis. Suspected moderate hiatal hernia. IMPRESSION: Multifocal pneumonia in this patient with known COVID.  Possible small left pleural effusion. Thoracic aortic atherosclerosis. Electronically Signed   By: Charline Bills M.D.   On: 08/27/2019 19:19    Procedures Procedures (including critical care time)  Medications Ordered in ED Medications  sodium chloride 0.9 % bolus 1,000 mL (0 mLs Intravenous Stopped 08/27/19 1948)     Initial Impression / Assessment and Plan / ED Course  I have reviewed the triage vital signs and the nursing notes.  Pertinent labs & imaging results that were available during my care of the patient were reviewed by me and considered in my medical decision making (see chart for details).        From a respiratory standpoint the patient is doing pretty well.  His sats dropped to 88% while walking in the room, but he has COPD so this is probably not too atypical.  However his renal function is abnormal with a creatinine of 1.99.  With his BUN of 58 this goes along with his history of decreased p.o. intake.  While we have not had labs in about 10 years, I think is reasonable to admit for fluids and see if this improves.  Otherwise, vitals are okay he appears stable for floor admission.  Ethel RanaJoseph A Vallely was evaluated in Emergency Department on 08/27/2019 for the symptoms described in the history of present illness. He was evaluated in the context of the global COVID-19 pandemic, which necessitated consideration that the patient might be at risk for infection with the SARS-CoV-2 virus that causes COVID-19. Institutional protocols and algorithms that pertain to the evaluation of patients at risk for COVID-19 are in a state of rapid change based on information released by regulatory bodies including the CDC and federal and state organizations. These policies and algorithms were followed during the patient's care in the ED.   Final Clinical Impressions(s) / ED Diagnoses   Final diagnoses:  COVID-19 virus infection  Acute kidney injury [facility](HCC)    ED Discharge Orders    None        Pricilla LovelessGoldston, Bronwyn Belasco, MD 08/27/19 2102

## 2019-08-27 NOTE — H&P (Signed)
History and Physical  Nicholas [name] DOB: Nov 05, 1945 DOA: 08/27/2019  Referring physician: ER provider PCP: System, Pcp Not In  Outpatient Specialists:    Patient coming from: Home  Chief Complaint: Fatigue and O2 sat of 88% with activity.    HPI: Patient is a 73 year old Caucasian male with past medical history significant for pyloric ulcer, PVD, duodenal stricture, hypothyroidism, hypertension, Leriche syndrome and hiatal hernia.  Apparently, patient and patient's wife were both diagnosed positive for Covid virus infection about 10 days ago after possible exposure to Covid at a wedding.  Patient has been managing supportively at home, but seems not to be getting better.  Patient's son brought patient to the hospital for further assessment and management due to generalized fatigue, poor p.o. intake and appetite.  Patient's oxygen saturation was also noted to have dropped to 88% with activity during his stay in the ER, but O2 sat improved to 92 to 93% at rest.  Collateral information revealed that patient may have history of COPD.  No headache, no neck pain, no fever or chills, no GI symptoms and no urinary symptoms.  Patient initially had cough that was productive, but now resolved.  Chest x-ray done revealed multifocal pneumonia that is in keeping with known Covid infection, with possible small left pleural effusion.  Serum creatinine was 1.99 and BUN was 58.  Last documented BMP was in 2010, and the serum creatinine was 0.88.  Hospitalist team has been asked to admit patient for further assessment and management.  ED Course: On presentation to the ER, patient was 98.1, blood pressure 154/75 mmHg, heart rate of 88, respiratory rate of 22 with O2 sat of 94%.  As documented above, O2 sat dropped to 88% with activity.  Patient was resuscitated with 1 L bolus of IV fluid.  Initial lab work done.  Hospitalist team has been asked to admit patient for further assessment and  management.  Pertinent labs: Chemistry reveals sodium of 136, potassium of 3.8, chloride 97, CO2 25, BUN of 50, creatinine of 1.99 with blood sugar 140.  AST is 44, ALT of 28, albumin of 3.4.  CBC reveals WBC of 8.5, hemoglobin of 15, hematocrit of 45.5, MCV of 92.5 with platelet count of 296.  Imaging: independently reviewed.  Chest x-ray revealed multilobar pneumonia.  Review of Systems:  Negative for fever, visual changes, sore throat, rash, new muscle aches, chest pain, dysuria, bleeding, n/v/abdominal pain.  Past Medical History:  Diagnosis Date  . Acute renal failure ([place])   . Carotid bruit   . Claudication ([place])   . Duodenal stricture   . GERD (gastroesophageal reflux disease)   . Hiatal hernia   . Hypertension   . Hypothyroidism   . Leriche's syndrome ([place])   . PVD (peripheral vascular disease) ([facility])    KNOWN AORTIC OCCLUSION  . Pyloric ulcer      reports that he has quit smoking. He does not have any smokeless tobacco history on file. No history on file for alcohol and drug.  Allergies  Allergen Reactions  . Morphine And Related Nausea Only    Family History  Problem Relation Age of Onset  . Other Father        CHOKED  . Other Mother        BLOOD POISONING  . Coronary artery disease Mother        CABG x5 LATE 70's  . Thyroid cancer Daughter        THYROIDECTOMY  Prior to Admission medications   Medication Sig Start Date End Date Taking? Authorizing Provider  amLODipine (NORVASC) 5 MG tablet Take 5 mg by mouth daily.     Yes [provider]  chlorpheniramine-HYDROcodone (TUSSIONEX) 10-8 MG/5ML SUER Take 5 mLs by mouth every 12 (twelve) hours as needed for cough. 08/20/19  Yes [provider]  cholecalciferol (VITAMIN D3) 25 MCG (1000 UT) tablet Take 2,000 Units by mouth daily.   Yes [provider]  fish oil-omega-3 fatty acids 1000 MG capsule Take 1 g by mouth daily.     Yes [provider]  ipratropium (ATROVENT) 0.06  % nasal spray Place 2 sprays into both nostrils 4 (four) times daily. 08/20/19  Yes [provider]  levothyroxine (SYNTHROID) 137 MCG tablet Take 137 mcg by mouth daily before breakfast.   Yes [provider]  lisinopril-hydrochlorothiazide (PRINZIDE,ZESTORETIC) 20-12.5 MG per tablet Take 2 tablets by mouth daily.     Yes [provider]  pantoprazole (PROTONIX) 40 MG tablet Take 40 mg by mouth daily.     Yes [provider]  Calcium Carbonate-Vitamin D (CALCIUM + D PO) Take by mouth daily.      [provider]    Physical Exam: Vitals:   08/27/19 1900 08/27/19 1937 08/27/19 1941 08/27/19 2000  BP: 131/71  (!) 135/96 (!) 154/75  Pulse: 88 (!) 101  88  Resp: (!) 24 (!) 27  (!) 22  Temp:      SpO2: 95% 92%  94%    Constitutional:  . Appears calm and comfortable Eyes:  . No pallor. No jaundice.  ENMT:  . external ears, nose appear normal Neck:  . Neck is supple. No JVD Respiratory:  . Decreased air entry . Respiratory effort normal. No retractions or accessory muscle use Cardiovascular:  . S1S2 . No LE extremity edema   Abdomen:  . Abdomen is soft and non tender. Organs are difficult to assess. Neurologic:  . Awake and alert. . Moves all limbs.  Wt Readings from Last 3 Encounters:  No data found for Wt    I have personally reviewed following labs and imaging studies  Labs on Admission:  CBC: Recent Labs  Lab 08/27/19 1827  WBC 8.5  NEUTROABS 7.5  HGB 15.0  HCT 45.5  MCV 92.5  PLT 296   Basic Metabolic Panel: Recent Labs  Lab 08/27/19 1827  NA 136  K 3.8  CL 97*  CO2 25  GLUCOSE 140*  BUN 58*  CREATININE 1.99*  CALCIUM 9.2   Liver Function Tests: Recent Labs  Lab 08/27/19 1827  AST 44*  ALT 28  ALKPHOS 59  BILITOT 0.4  PROT 8.0  ALBUMIN 3.4*   No results for input(s): LIPASE, AMYLASE in the last 168 hours. No results for input(s): AMMONIA in the last 168 hours. Coagulation Profile: No results  for input(s): INR, PROTIME in the last 168 hours. Cardiac Enzymes: No results for input(s): CKTOTAL, CKMB, CKMBINDEX, TROPONINI in the last 168 hours. BNP (last 3 results) No results for input(s): PROBNP in the last 8760 hours. HbA1C: No results for input(s): HGBA1C in the last 72 hours. CBG: No results for input(s): GLUCAP in the last 168 hours. Lipid Profile: No results for input(s): CHOL, HDL, LDLCALC, TRIG, CHOLHDL, LDLDIRECT in the last 72 hours. Thyroid Function Tests: No results for input(s): TSH, T4TOTAL, FREET4, T3FREE, THYROIDAB in the last 72 hours. Anemia Panel: No results for input(s): VITAMINB12, FOLATE, FERRITIN, TIBC, IRON, RETICCTPCT in the  last 72 hours. Urine analysis: No results found for: COLORURINE, APPEARANCEUR, LABSPEC, PHURINE, GLUCOSEU, HGBUR, BILIRUBINUR, KETONESUR, PROTEINUR, UROBILINOGEN, NITRITE, LEUKOCYTESUR Sepsis Labs: @LABRCNTIP (procalcitonin:4,lacticidven:4) ) Recent Results (from the past 240 hour(s))  Novel Coronavirus, NAA ([facility])     Status: Abnormal   Collection Time: 08/22/19 12:00 AM   Specimen: Nasopharyngeal(NP) swabs in vial transport medium   NASOPHARYNGE  TESTING  Result Value Ref Range Status   SARS-CoV-2, NAA Detected (A) Not Detected Final    Comment: This nucleic acid amplification test was developed and its performance characteristics determined by [facility]. Nucleic acid amplification tests include PCR and TMA. This test has not been FDA cleared or approved. This test has been authorized by FDA under an Emergency Use Authorization (EUA). This test is only authorized for the duration of time the declaration that circumstances exist justifying the authorization of the emergency use of in vitro diagnostic tests for detection of SARS-CoV-2 virus and/or diagnosis of COVID-19 infection under section 564(b)(1) of the Act, 21 U.S.C. 811BJY-7(W360bbb-3(b) (1), unless the authorization is terminated or revoked sooner. When diagnostic  testing is negative, the possibility of a false negative result should be considered in the context of a patient's recent exposures and the presence of clinical signs and symptoms consistent with COVID-19. An individual without symptoms of COVID-19 and who is not shedding SARS-CoV-2 virus would  expect to have a negative (not detected) result in this assay.       Radiological Exams on Admission: Dg Chest Portable 1 View  Result Date: 08/27/2019 CLINICAL DATA:  Shortness of breath, COVID positive EXAM: PORTABLE CHEST 1 VIEW COMPARISON:  08/16/2009 FINDINGS: Left upper lobe and bilateral lower lobe opacities, compatible with pneumonia in this patient with known COVID. Possible small left pleural effusion. No pneumothorax. The heart is top-normal in size.  Thoracic aortic atherosclerosis. Suspected moderate hiatal hernia. IMPRESSION: Multifocal pneumonia in this patient with known COVID. Possible small left pleural effusion. Thoracic aortic atherosclerosis. Electronically Signed   By: Charline BillsSriyesh  Krishnan M.D.   On: 08/27/2019 19:19    EKG: Independently reviewed.   Active Problems:   Pneumonia due to severe acute respiratory syndrome coronavirus 2 (SARS-CoV-2)   Assessment/Plan Pneumonia due to severe acute respiratory syndrome coronavirus 2 (SARS-CoV-2): -Admit patient for further assessment and management. -Check inflammatory markers daily. -Supportive care. -IV dexamethasone 6 Mg daily for 10 days. -Based on clinical course, will have a low threshold to start remdesivir. -Further management will depend on hospital course.  Suspected acute kidney injury: -Acute kidney injury could be prerenal. -Cannot rule out Covid associated acute kidney injury. -Check urine sodium, check urine protein and creatinine, renal ultrasound. -Hold lisinopril and HCTZ. -Urinalysis. -Further management will depend on hospital course.  Hypertension: -Continue to optimize. -Goal blood pressure should  be below 130/80 mmHg. -Increase the dose of Norvasc to 10 mg p.o. once daily and lisinopril/HCTZ will be held. -Use hydralazine as needed for systolic blood pressure greater than 170 mmHg.  History of peripheral vascular disease: Stable.  DVT prophylaxis: Subcutaneous heparin Code Status: Full code Family Communication:  Disposition Plan: This will depend on hospital course Consults called: None Admission status: Inpatient  Time spent: 65 minutes  Berton MountSylvester Kaylynne Andres, MD  [facility] Pager #: [phone] 7PM-7AM contact night coverage as above  08/27/2019, 9:30 PM

## 2019-08-28 ENCOUNTER — Inpatient Hospital Stay (HOSPITAL_COMMUNITY): Payer: Medicare HMO

## 2019-08-28 ENCOUNTER — Encounter (HOSPITAL_COMMUNITY): Payer: Self-pay | Admitting: *Deleted

## 2019-08-28 ENCOUNTER — Inpatient Hospital Stay (HOSPITAL_COMMUNITY): Admission: RE | Admit: 2019-08-28 | Payer: Medicare HMO | Source: Intra-hospital | Admitting: Internal Medicine

## 2019-08-28 DIAGNOSIS — J9601 Acute respiratory failure with hypoxia: Secondary | ICD-10-CM

## 2019-08-28 LAB — CBC WITH DIFFERENTIAL/PLATELET
Abs Immature Granulocytes: 0.09 10*3/uL — ABNORMAL HIGH (ref 0.00–0.07)
Basophils Absolute: 0 10*3/uL (ref 0.0–0.1)
Basophils Relative: 0 %
Eosinophils Absolute: 0 10*3/uL (ref 0.0–0.5)
Eosinophils Relative: 0 %
HCT: 44.1 % (ref 39.0–52.0)
Hemoglobin: 14.5 g/dL (ref 13.0–17.0)
Immature Granulocytes: 1 %
Lymphocytes Relative: 8 %
Lymphs Abs: 0.6 10*3/uL — ABNORMAL LOW (ref 0.7–4.0)
MCH: 30.7 pg (ref 26.0–34.0)
MCHC: 32.9 g/dL (ref 30.0–36.0)
MCV: 93.4 fL (ref 80.0–100.0)
Monocytes Absolute: 0.2 10*3/uL (ref 0.1–1.0)
Monocytes Relative: 2 %
Neutro Abs: 6.7 10*3/uL (ref 1.7–7.7)
Neutrophils Relative %: 89 %
Platelets: 277 10*3/uL (ref 150–400)
RBC: 4.72 MIL/uL (ref 4.22–5.81)
RDW: 13.3 % (ref 11.5–15.5)
WBC: 7.5 10*3/uL (ref 4.0–10.5)
nRBC: 0 % (ref 0.0–0.2)

## 2019-08-28 LAB — COMPREHENSIVE METABOLIC PANEL
ALT: 25 U/L (ref 0–44)
AST: 45 U/L — ABNORMAL HIGH (ref 15–41)
Albumin: 3 g/dL — ABNORMAL LOW (ref 3.5–5.0)
Alkaline Phosphatase: 54 U/L (ref 38–126)
Anion gap: 11 (ref 5–15)
BUN: 37 mg/dL — ABNORMAL HIGH (ref 8–23)
CO2: 24 mmol/L (ref 22–32)
Calcium: 8.6 mg/dL — ABNORMAL LOW (ref 8.9–10.3)
Chloride: 102 mmol/L (ref 98–111)
Creatinine, Ser: 1.42 mg/dL — ABNORMAL HIGH (ref 0.61–1.24)
GFR calc Af Amer: 56 mL/min — ABNORMAL LOW (ref >60)
GFR calc non Af Amer: 49 mL/min — ABNORMAL LOW (ref >60)
Glucose, Bld: 145 mg/dL — ABNORMAL HIGH (ref 70–99)
Potassium: 4.1 mmol/L (ref 3.5–5.1)
Sodium: 137 mmol/L (ref 135–145)
Total Bilirubin: 0.4 mg/dL (ref 0.3–1.2)
Total Protein: 6.9 g/dL (ref 6.5–8.1)

## 2019-08-28 LAB — PROCALCITONIN: Procalcitonin: 0.16 ng/mL

## 2019-08-28 LAB — C-REACTIVE PROTEIN
CRP: 14.9 mg/dL — ABNORMAL HIGH (ref <1.0)
CRP: 15.3 mg/dL — ABNORMAL HIGH (ref <1.0)

## 2019-08-28 LAB — PROTEIN / CREATININE RATIO, URINE
Creatinine, Urine: 88.21 mg/dL
Protein Creatinine Ratio: 0.39 mg/mg{Cre} — ABNORMAL HIGH (ref 0.00–0.15)
Total Protein, Urine: 34 mg/dL

## 2019-08-28 LAB — D-DIMER, QUANTITATIVE
D-Dimer, Quant: 2.63 ug/mL-FEU — ABNORMAL HIGH (ref 0.00–0.50)
D-Dimer, Quant: 2.95 ug/mL-FEU — ABNORMAL HIGH (ref 0.00–0.50)

## 2019-08-28 LAB — SODIUM, URINE, RANDOM: Sodium, Ur: 89 mmol/L

## 2019-08-28 LAB — PHOSPHORUS: Phosphorus: 3 mg/dL (ref 2.5–4.6)

## 2019-08-28 LAB — ABO/RH: ABO/RH(D): A POS

## 2019-08-28 LAB — FERRITIN
Ferritin: 445 ng/mL — ABNORMAL HIGH (ref 24–336)
Ferritin: 460 ng/mL — ABNORMAL HIGH (ref 24–336)

## 2019-08-28 LAB — MAGNESIUM: Magnesium: 2 mg/dL (ref 1.7–2.4)

## 2019-08-28 MED ORDER — VITAMIN D 25 MCG (1000 UNIT) PO TABS
2000.0000 [IU] | ORAL_TABLET | Freq: Every day | ORAL | Status: DC
  Administered 2019-08-28 – 2019-09-02 (×6): 2000 [IU] via ORAL
  Filled 2019-08-28 (×6): qty 2

## 2019-08-28 MED ORDER — VITAMIN B-1 100 MG PO TABS
100.0000 mg | ORAL_TABLET | Freq: Every day | ORAL | Status: DC
  Administered 2019-08-28 – 2019-09-02 (×6): 100 mg via ORAL
  Filled 2019-08-28 (×6): qty 1

## 2019-08-28 MED ORDER — FOLIC ACID 1 MG PO TABS
1.0000 mg | ORAL_TABLET | Freq: Every day | ORAL | Status: DC
  Administered 2019-08-28 – 2019-09-02 (×6): 1 mg via ORAL
  Filled 2019-08-28 (×6): qty 1

## 2019-08-28 MED ORDER — SODIUM CHLORIDE 0.9 % IV SOLN
INTRAVENOUS | Status: AC
  Administered 2019-08-28 (×2): via INTRAVENOUS

## 2019-08-28 MED ORDER — IPRATROPIUM BROMIDE 0.06 % NA SOLN
2.0000 | Freq: Four times a day (QID) | NASAL | Status: DC
  Administered 2019-08-28 – 2019-08-29 (×5): 2 via NASAL
  Filled 2019-08-28 (×2): qty 15

## 2019-08-28 MED ORDER — AMLODIPINE BESYLATE 5 MG PO TABS
10.0000 mg | ORAL_TABLET | Freq: Every day | ORAL | Status: DC
  Administered 2019-08-28 – 2019-09-02 (×5): 10 mg via ORAL
  Filled 2019-08-28 (×6): qty 2

## 2019-08-28 MED ORDER — LEVOTHYROXINE SODIUM 137 MCG PO TABS
137.0000 ug | ORAL_TABLET | Freq: Every day | ORAL | Status: DC
  Administered 2019-08-28 – 2019-09-02 (×6): 137 ug via ORAL
  Filled 2019-08-28 (×8): qty 1

## 2019-08-28 MED ORDER — HYDRALAZINE HCL 50 MG PO TABS: 25.0000 mg | ORAL_TABLET | Freq: Four times a day (QID) | ORAL | Status: DC | PRN

## 2019-08-28 MED ORDER — HEPARIN SODIUM (PORCINE) 5000 UNIT/ML IJ SOLN
5000.0000 [IU] | Freq: Three times a day (TID) | INTRAMUSCULAR | Status: DC
  Administered 2019-08-28 – 2019-08-30 (×6): 5000 [IU] via SUBCUTANEOUS
  Filled 2019-08-28 (×9): qty 1

## 2019-08-28 MED ORDER — ZINC SULFATE 220 (50 ZN) MG PO CAPS
220.0000 mg | ORAL_CAPSULE | Freq: Every day | ORAL | Status: DC
  Administered 2019-08-28 – 2019-09-02 (×6): 220 mg via ORAL
  Filled 2019-08-28 (×6): qty 1

## 2019-08-28 MED ORDER — DEXAMETHASONE SODIUM PHOSPHATE 10 MG/ML IJ SOLN
6.0000 mg | INTRAMUSCULAR | Status: DC
  Administered 2019-08-28 – 2019-09-02 (×6): 6 mg via INTRAVENOUS
  Filled 2019-08-28 (×6): qty 1

## 2019-08-28 MED ORDER — PANTOPRAZOLE SODIUM 40 MG PO TBEC
40.0000 mg | DELAYED_RELEASE_TABLET | Freq: Every day | ORAL | Status: DC
  Administered 2019-08-28 – 2019-09-02 (×6): 40 mg via ORAL
  Filled 2019-08-28 (×6): qty 1

## 2019-08-28 MED ORDER — ADULT MULTIVITAMIN W/MINERALS CH
1.0000 | ORAL_TABLET | Freq: Every day | ORAL | Status: DC
  Administered 2019-08-28 – 2019-09-02 (×6): 1 via ORAL
  Filled 2019-08-28 (×6): qty 1

## 2019-08-28 NOTE — ED Notes (Signed)
Main lab is going to add on blood and will call if they are unable to run something from previous blood.

## 2019-08-28 NOTE — Progress Notes (Signed)
PROGRESS NOTE    Nicholas [name]  WEX:[id number] DOB: 11-10-1945 DOA: 08/27/2019 PCP: System, Pcp Not In    Brief Narrative:  73 year old gentleman with history of peptic ulcer, hypothyroidism hypertension.  Patient and wife were diagnosed with Covid virus infection about 10 days ago.  They were managing at home.  Patient started having poor appetite, generalized fatigue poor oral intake and some shortness of breath or at activity.  They measured his oxygen 88% during activity at home.  His wife is also having similar symptoms and they both presented to the ER. In the emergency room afebrile, blood pressure stable.  Heart rate is stable.  Oxygen 88% on activity, 94% with 2 L oxygen.  Chest x-ray shows multifocal pneumonia.  Creatinine 1.9 with baseline creatinine of 0.88 long time ago.  LFTs were normal.  Patient admitted for treatment for COVID-19 pneumonia.   Assessment & Plan:   Active Problems:   Pneumonia due to severe acute respiratory syndrome coronavirus 2 (SARS-CoV-2)   Acute respiratory failure with hypoxia ([facility])  Pneumonia due to COVID-19 virus with hypoxia: Agree with continuing monitoring in the hospital given severity of symptoms. incentive spirometry, deep breathing exercises, sputum induction, mucolytic's and bronchodilators. Supplemental oxygen to keep saturations more than 90%. Dexamethasone 6 mg daily for 10 days. Remdesivir loading dose and maintenance dose for 5 days.  AKI: Baseline renal functions unknown.  Last known was normal.  Patient with poor appetite, ongoing use of lisinopril and hydrochlorothiazide.  Treated with gentle IV fluids with already showing signs of improvement. Continue to hold lisinopril hydrochlorothiazide.  We will continue low-dose IV fluids today.  Renal ultrasound was normal.  Recheck tomorrow morning.  Hypertension: Blood pressure is stable.  Currently on Norvasc.  Hypothyroidism: On Synthroid continue.   DVT prophylaxis: Heparin  subcu Code Status: Full code Family Communication: None Disposition Plan: Remains in the hospital.  Transfer to [facility] to continue inpatient management.   Consultants:   None  Procedures:   None  Antimicrobials:   Remdesivir, 08/27/2019---   Subjective: Patient seen and examined.  Denies any complaints.  Still in the emergency room and waiting for transfer to [facility].  Afebrile overnight. When he is resting he is not using oxygen and saturates around 91%.  Objective: Vitals:   08/28/19 0600 08/28/19 0630 08/28/19 0730 08/28/19 0800  BP: 128/79 140/75 (!) 143/76 135/79  Pulse: 70 83 72 85  Resp: (!) 22 (!) 23 (!) 26 (!) 25  Temp:      TempSrc:      SpO2: 90% 92% 91% 90%    Intake/Output Summary (Last 24 hours) at 08/28/2019 1022 Last data filed at 08/27/2019 1948 Gross per 24 hour  Intake 1000 ml  Output -  Net 1000 ml   There were no vitals filed for this visit.  Examination:  General exam: Appears calm and comfortable, on room air. Respiratory system: Clear to auscultation. Respiratory effort normal.  No added sounds. Cardiovascular system: S1 & S2 heard, RRR. No JVD, murmurs, rubs, gallops or clicks. No pedal edema. Gastrointestinal system: Abdomen is nondistended, soft and nontender. No organomegaly or masses felt. Normal bowel sounds heard. Central nervous system: Alert and oriented. No focal neurological deficits. Extremities: Symmetric 5 x 5 power. Skin: No rashes, lesions or ulcers Psychiatry: Judgement and insight appear normal. Mood & affect appropriate.     Data Reviewed: I have personally reviewed following labs and imaging studies  CBC: Recent Labs  Lab 08/27/19 1827  08/28/19 0637  WBC 8.5 7.5  NEUTROABS 7.5 6.7  HGB 15.0 14.5  HCT 45.5 44.1  MCV 92.5 93.4  PLT 296 277   Basic Metabolic Panel: Recent Labs  Lab 08/27/19 1827 08/28/19 0637  NA 136 137  K 3.8 4.1  CL 97* 102  CO2 25 24  GLUCOSE 140* 145*   BUN 58* 37*  CREATININE 1.99* 1.42*  CALCIUM 9.2 8.6*  MG  --  2.0  PHOS  --  3.0   GFR: CrCl cannot be calculated (Unknown ideal weight.). Liver Function Tests: Recent Labs  Lab 08/27/19 1827 08/28/19 0637  AST 44* 45*  ALT 28 25  ALKPHOS 59 54  BILITOT 0.4 0.4  PROT 8.0 6.9  ALBUMIN 3.4* 3.0*   No results for input(s): LIPASE, AMYLASE in the last 168 hours. No results for input(s): AMMONIA in the last 168 hours. Coagulation Profile: No results for input(s): INR, PROTIME in the last 168 hours. Cardiac Enzymes: No results for input(s): CKTOTAL, CKMB, CKMBINDEX, TROPONINI in the last 168 hours. BNP (last 3 results) No results for input(s): PROBNP in the last 8760 hours. HbA1C: No results for input(s): HGBA1C in the last 72 hours. CBG: No results for input(s): GLUCAP in the last 168 hours. Lipid Profile: No results for input(s): CHOL, HDL, LDLCALC, TRIG, CHOLHDL, LDLDIRECT in the last 72 hours. Thyroid Function Tests: No results for input(s): TSH, T4TOTAL, FREET4, T3FREE, THYROIDAB in the last 72 hours. Anemia Panel: Recent Labs    08/28/19 0210 08/28/19 0637  FERRITIN 445* 460*   Sepsis Labs: Recent Labs  Lab 08/27/19 1830  PROCALCITON 0.16    Recent Results (from the past 240 hour(s))  Novel Coronavirus, NAA ([facility])     Status: Abnormal   Collection Time: 08/22/19 12:00 AM   Specimen: Nasopharyngeal(NP) swabs in vial transport medium   NASOPHARYNGE  TESTING  Result Value Ref Range Status   SARS-CoV-2, NAA Detected (A) Not Detected Final    Comment: This nucleic acid amplification test was developed and its performance characteristics determined by [facility]. Nucleic acid amplification tests include PCR and TMA. This test has not been FDA cleared or approved. This test has been authorized by FDA under an Emergency Use Authorization (EUA). This test is only authorized for the duration of time the declaration that circumstances  exist justifying the authorization of the emergency use of in vitro diagnostic tests for detection of SARS-CoV-2 virus and/or diagnosis of COVID-19 infection under section 564(b)(1) of the Act, 21 U.S.C. 960AVW-0(J360bbb-3(b) (1), unless the authorization is terminated or revoked sooner. When diagnostic testing is negative, the possibility of a false negative result should be considered in the context of a patient's recent exposures and the presence of clinical signs and symptoms consistent with COVID-19. An individual without symptoms of COVID-19 and who is not shedding SARS-CoV-2 virus would  expect to have a negative (not detected) result in this assay.          Radiology Studies: [place] Renal  Result Date: 08/28/2019 CLINICAL DATA:  Acute renal injury EXAM: RENAL / URINARY TRACT ULTRASOUND COMPLETE COMPARISON:  None. FINDINGS: Right Kidney: Renal measurements: 12.1 x 5.7 x 6.4 cm. = volume: 234 mL. No hydronephrosis is noted. A 1.8 cm cyst is noted in the midportion of the right kidney. Left Kidney: Renal measurements: 10.6 x 5.5 x 6.4 cm. = volume: 195 mL. Echogenicity within normal limits. No mass or hydronephrosis visualized. Bladder: Decompressed Other: Prostate is mildly prominent indenting upon the inferior aspect  of bladder. IMPRESSION: Right renal cyst. No other focal abnormality is noted. Electronically Signed   By: Inez Catalina M.D.   On: 08/28/2019 03:21   Dg Chest Portable 1 View  Result Date: 08/27/2019 CLINICAL DATA:  Shortness of breath, COVID positive EXAM: PORTABLE CHEST 1 VIEW COMPARISON:  08/16/2009 FINDINGS: Left upper lobe and bilateral lower lobe opacities, compatible with pneumonia in this patient with known COVID. Possible small left pleural effusion. No pneumothorax. The heart is top-normal in size.  Thoracic aortic atherosclerosis. Suspected moderate hiatal hernia. IMPRESSION: Multifocal pneumonia in this patient with known COVID. Possible small left pleural effusion.  Thoracic aortic atherosclerosis. Electronically Signed   By: Julian Hy M.D.   On: 08/27/2019 19:19        Scheduled Meds: . amLODipine  10 mg Oral Daily  . cholecalciferol  2,000 Units Oral Daily  . dexamethasone (DECADRON) injection  6 mg Intravenous Q24H  . folic acid  1 mg Oral Daily  . heparin  5,000 Units Subcutaneous Q8H  . ipratropium  2 spray Each Nare QID  . levothyroxine  137 mcg Oral QAC breakfast  . multivitamin with minerals  1 tablet Oral Daily  . pantoprazole  40 mg Oral Daily  . thiamine  100 mg Oral Daily  . zinc sulfate  220 mg Oral Daily   Continuous Infusions: . sodium chloride 50 mL/hr at 08/28/19 0237  . remdesivir 100 mg in NS 250 mL       LOS: 1 day    Time spent: 35 minutes    Barb Merino, MD [facility] Pager [phone]

## 2019-08-28 NOTE — ED Notes (Signed)
[facility] bedside 

## 2019-08-28 NOTE — ED Notes (Signed)
[facility] called for transport. 

## 2019-08-28 NOTE — ED Notes (Signed)
[facility] dispatch notified for need of transport.  

## 2019-08-29 DIAGNOSIS — I10 Essential (primary) hypertension: Secondary | ICD-10-CM

## 2019-08-29 LAB — COMPREHENSIVE METABOLIC PANEL
ALT: 26 U/L (ref 0–44)
AST: 44 U/L — ABNORMAL HIGH (ref 15–41)
Albumin: 3.2 g/dL — ABNORMAL LOW (ref 3.5–5.0)
Alkaline Phosphatase: 50 U/L (ref 38–126)
Anion gap: 12 (ref 5–15)
BUN: 41 mg/dL — ABNORMAL HIGH (ref 8–23)
CO2: 24 mmol/L (ref 22–32)
Calcium: 8.9 mg/dL (ref 8.9–10.3)
Chloride: 102 mmol/L (ref 98–111)
Creatinine, Ser: 1.31 mg/dL — ABNORMAL HIGH (ref 0.61–1.24)
GFR calc Af Amer: >60 mL/min (ref >60)
GFR calc non Af Amer: 54 mL/min — ABNORMAL LOW (ref >60)
Glucose, Bld: 127 mg/dL — ABNORMAL HIGH (ref 70–99)
Potassium: 4.3 mmol/L (ref 3.5–5.1)
Sodium: 138 mmol/L (ref 135–145)
Total Bilirubin: 0.7 mg/dL (ref 0.3–1.2)
Total Protein: 6.8 g/dL (ref 6.5–8.1)

## 2019-08-29 LAB — CBC WITH DIFFERENTIAL/PLATELET
Abs Immature Granulocytes: 0.07 10*3/uL (ref 0.00–0.07)
Basophils Absolute: 0 10*3/uL (ref 0.0–0.1)
Basophils Relative: 0 %
Eosinophils Absolute: 0 10*3/uL (ref 0.0–0.5)
Eosinophils Relative: 0 %
HCT: 41.7 % (ref 39.0–52.0)
Hemoglobin: 13.7 g/dL (ref 13.0–17.0)
Immature Granulocytes: 1 %
Lymphocytes Relative: 16 %
Lymphs Abs: 0.9 10*3/uL (ref 0.7–4.0)
MCH: 30.3 pg (ref 26.0–34.0)
MCHC: 32.9 g/dL (ref 30.0–36.0)
MCV: 92.3 fL (ref 80.0–100.0)
Monocytes Absolute: 0.5 10*3/uL (ref 0.1–1.0)
Monocytes Relative: 9 %
Neutro Abs: 4.2 10*3/uL (ref 1.7–7.7)
Neutrophils Relative %: 74 %
Platelets: 308 10*3/uL (ref 150–400)
RBC: 4.52 MIL/uL (ref 4.22–5.81)
RDW: 13.3 % (ref 11.5–15.5)
WBC: 5.7 10*3/uL (ref 4.0–10.5)
nRBC: 0 % (ref 0.0–0.2)

## 2019-08-29 LAB — D-DIMER, QUANTITATIVE: D-Dimer, Quant: 3.04 ug/mL-FEU — ABNORMAL HIGH (ref 0.00–0.50)

## 2019-08-29 LAB — C-REACTIVE PROTEIN: CRP: 11.2 mg/dL — ABNORMAL HIGH (ref <1.0)

## 2019-08-29 LAB — FERRITIN: Ferritin: 625 ng/mL — ABNORMAL HIGH (ref 24–336)

## 2019-08-29 LAB — MAGNESIUM: Magnesium: 2 mg/dL (ref 1.7–2.4)

## 2019-08-29 MED ORDER — GUAIFENESIN-DM 100-10 MG/5ML PO SYRP
5.0000 mL | ORAL_SOLUTION | ORAL | Status: DC | PRN
  Administered 2019-08-29 – 2019-08-30 (×2): 5 mL via ORAL
  Filled 2019-08-29 (×2): qty 5

## 2019-08-29 MED ORDER — VITAMIN C 500 MG PO TABS
500.0000 mg | ORAL_TABLET | Freq: Every day | ORAL | Status: DC
  Administered 2019-08-29 – 2019-09-02 (×5): 500 mg via ORAL
  Filled 2019-08-29 (×5): qty 1

## 2019-08-29 MED ORDER — ACETAMINOPHEN 325 MG PO TABS
650.0000 mg | ORAL_TABLET | Freq: Four times a day (QID) | ORAL | Status: DC | PRN
  Administered 2019-08-29 – 2019-08-31 (×4): 650 mg via ORAL
  Filled 2019-08-29 (×4): qty 2

## 2019-08-29 MED ORDER — ENSURE MAX PROTEIN PO LIQD
11.0000 [oz_av] | Freq: Every day | ORAL | Status: DC
  Administered 2019-08-30 – 2019-09-01 (×2): 11 [oz_av] via ORAL
  Filled 2019-08-29 (×5): qty 330

## 2019-08-29 NOTE — Plan of Care (Addendum)
Instructed to prone tonight while sleeping, patient deferred. Stated "I can't sleep on my stomach. My back hurts to much." Encouraged to sleep on his side and agreed he will try.  2220 Patient ambulated to BR and back to bed without difficulty. Dry, strong cough with some Rib soreness. 02 decreasing to 87% and 02 increased to 3L/[place] and sats up into low 90%. Patient rested quietly after prn Tylenol and Robitussin intervention. Patient remained on back for sleep.

## 2019-08-29 NOTE — Progress Notes (Addendum)
PROGRESS NOTE  Nicholas [name] OVZ:[id number] DOB: [birth year] DOA: 08/27/2019  PCP: System, Pcp Not In  Brief History/Interval Summary: 73 year old gentleman with history of peptic ulcer, hypothyroidism hypertension. Patient and his wife were diagnosed with Covid virus infection about 10 days ago.  They were managing at home.  Subsequently started having worsening symptoms.  Started feeling short of breath.  His wife is also experiencing similar symptoms.  They both presented to the emergency department.  He was found to be hypoxic.  Chest x-ray showed multifocal pneumonia.  He was hospitalized for further management.  Reason for Visit: Acute respiratory failure with hypoxia.  Pneumonia due to COVID-19.  Consultants: None  Procedures: None  Antibiotics: Anti-infectives (From admission, onward)   Start     Dose/Rate Route Frequency Ordered Stop   08/28/19 1830  remdesivir 100 mg in sodium chloride 0.9 % 250 mL IVPB     100 mg 500 mL/hr over 30 Minutes Intravenous Daily 08/27/19 2228 09/01/19 0959   08/27/19 2230  remdesivir 200 mg in sodium chloride 0.9 % 250 mL IVPB     200 mg 500 mL/hr over 30 Minutes Intravenous Once 08/27/19 2141 08/27/19 2359      Subjective/Interval History: Patient states that he is feeling slightly better today compared to yesterday.  Still has shortness of breath with exertion.  Is also coughing a lot mostly with whitish expectoration.  Sometimes dry.  Denies any chest pain.  No nausea vomiting.  No diarrhea.    Assessment/Plan:  Acute Hypoxic Resp. Failure/Pneumonia due to COVID-19  COVID-19 Labs  Recent Labs    08/27/19 1830 08/28/19 0210 08/28/19 0637 08/29/19 0020  DDIMER 2.63*  --  2.95* 3.04*  FERRITIN  --  445* 460* 625*  CRP  --  14.9* 15.3* 11.2*    Lab Results  Component Value Date   SARSCOV2NAA Detected (A) 08/22/2019     Objective findings: Fever: Afebrile Oxygen requirements: Nasal cannula 2 to 3 L.  Saturating the early  90s.  COVID 19 Therapeutics: Antibacterials: None Remdesivir: Day 3 Steroids: Dexamethasone 6 mg daily Diuretics: Not on scheduled basis Actemra: Not given yet Convalescent Plasma: Not given yet Vitamin C and Zinc: Will initiate PUD Prophylaxis: On Protonix DVT Prophylaxis: Subcutaneous heparin  Patient is stable from a respiratory standpoint.  He continues to require supplemental oxygen.  Continue remdesivir and steroids.  Procalcitonin was 0.16.  No clear indication to initiate antibacterial agents.  Mobilize.  Incentive spirometry.  Prone positioning as much as possible.  Antitussive agents.  CRP is noted to be 11.2, improved from 15.3 yesterday.  D-dimer 3.04.  Ferritin 625.  Continue to trend.  Actemra and convalescent plasma briefly discussed with the patient although considering that he is improving will hold off on offering these therapeutics.  Acute renal failure Likely due to hypovolemia from COVID-19.  Patient was also on ACE inhibitor and hydrochlorothiazide which have been held.  Patient was gently hydrated.  Renal function has improved.  Monitor urine output.  Essential hypertension Patient on amlodipine which is being continued.  Monitor blood pressures closely.  Lisinopril and HCTZ on hold.  Hypothyroidism Continue with levothyroxine.  DVT Prophylaxis: Subcutaneous heparin Code Status: Full code Family Communication: Discussed with the patient.  His wife is in the adjacent bed.  Will call their son as well. Disposition Plan: Out of bed to chair.  Continue current management.  Hopefully home when ready for discharge.   Medications:  Scheduled: . amLODipine  10 mg Oral  Daily  . cholecalciferol  2,000 Units Oral Daily  . dexamethasone (DECADRON) injection  6 mg Intravenous Q24H  . folic acid  1 mg Oral Daily  . heparin  5,000 Units Subcutaneous Q8H  . ipratropium  2 spray Each Nare QID  . levothyroxine  137 mcg Oral QAC breakfast  . multivitamin with minerals  1  tablet Oral Daily  . pantoprazole  40 mg Oral Daily  . thiamine  100 mg Oral Daily  . vitamin C  500 mg Oral Daily  . zinc sulfate  220 mg Oral Daily   Continuous: . remdesivir 100 mg in NS 250 mL 100 mg (08/29/19 1030)   ZOX:[id number]:hydrALAZINE   Objective:  Vital Signs  Vitals:   08/28/19 2010 08/29/19 0406 08/29/19 0757 08/29/19 1029  BP: 132/71 113/61 (!) 101/51 104/67  Pulse:  (!) 59 (!) 59   Resp: 19 (!) 22 15   Temp:  98 F (36.7 C) 98.8 F (37.1 C)   TempSrc:  Oral Oral   SpO2: 90% 91% 92%   Weight:      Height:        Intake/Output Summary (Last 24 hours) at 08/29/2019 1033 Last data filed at 08/29/2019 1000 Gross per 24 hour  Intake 1272.43 ml  Output 450 ml  Net 822.43 ml   Filed Weights   08/28/19 1733  Weight: 76.3 kg    General appearance: Awake alert.  In no distress Resp: Mildly tachypneic at rest.  Coarse breath sounds bilaterally with few crackles at the bases.  No wheezing or rhonchi. Cardio: S1-S2 is normal regular.  No S3-S4.  No rubs murmurs or bruit GI: Abdomen is soft.  Nontender nondistended.  Bowel sounds are present normal.  No masses organomegaly Extremities: No edema.  Full range of motion of lower extremities. Neurologic: Alert and oriented x3.  No focal neurological deficits.    Lab Results:  Data Reviewed: I have personally reviewed following labs and imaging studies  CBC: Recent Labs  Lab 08/27/19 1827 08/28/19 0637 08/29/19 0020  WBC 8.5 7.5 5.7  NEUTROABS 7.5 6.7 4.2  HGB 15.0 14.5 13.7  HCT 45.5 44.1 41.7  MCV 92.5 93.4 92.3  PLT 296 277 308    Basic Metabolic Panel: Recent Labs  Lab 08/27/19 1827 08/28/19 0637 08/29/19 0020  NA 136 137 138  K 3.8 4.1 4.3  CL 97* 102 102  CO2 25 24 24   GLUCOSE 140* 145* 127*  BUN 58* 37* 41*  CREATININE 1.99* 1.42* 1.31*  CALCIUM 9.2 8.6* 8.9  MG  --  2.0 2.0  PHOS  --  3.0  --     GFR: Estimated Creatinine Clearance: 48.6 mL/min (A) (by C-G formula based on SCr of 1.31  mg/dL (H)).  Liver Function Tests: Recent Labs  Lab 08/27/19 1827 08/28/19 0637 08/29/19 0020  AST 44* 45* 44*  ALT 28 25 26   ALKPHOS 59 54 50  BILITOT 0.4 0.4 0.7  PROT 8.0 6.9 6.8  ALBUMIN 3.4* 3.0* 3.2*     Anemia Panel: Recent Labs    08/28/19 0637 08/29/19 0020  FERRITIN 460* 625*    Recent Results (from the past 240 hour(s))  Novel Coronavirus, NAA ([facility])     Status: Abnormal   Collection Time: 08/22/19 12:00 AM   Specimen: Nasopharyngeal(NP) swabs in vial transport medium   NASOPHARYNGE  TESTING  Result Value Ref Range Status   SARS-CoV-2, NAA Detected (A) Not Detected Final    Comment: This nucleic  acid amplification test was developed and its performance characteristics determined by [employer]. Nucleic acid amplification tests include PCR and TMA. This test has not been FDA cleared or approved. This test has been authorized by FDA under an Emergency Use Authorization (EUA). This test is only authorized for the duration of time the declaration that circumstances exist justifying the authorization of the emergency use of in vitro diagnostic tests for detection of SARS-CoV-2 virus and/or diagnosis of COVID-19 infection under section 564(b)(1) of the Act, 21 U.S.C. 034VQQ-5(Z) (1), unless the authorization is terminated or revoked sooner. When diagnostic testing is negative, the possibility of a false negative result should be considered in the context of a patient's recent exposures and the presence of clinical signs and symptoms consistent with COVID-19. An individual without symptoms of COVID-19 and who is not shedding SARS-CoV-2 virus would  expect to have a negative (not detected) result in this assay.       Radiology Studies: US Renal  Result Date: 08/28/2019 CLINICAL DATA:  Acute renal injury EXAM: RENAL / URINARY TRACT ULTRASOUND COMPLETE COMPARISON:  None. FINDINGS: Right Kidney: Renal measurements: 12.1 x 5.7 x 6.4 cm. = volume:  234 mL. No hydronephrosis is noted. A 1.8 cm cyst is noted in the midportion of the right kidney. Left Kidney: Renal measurements: 10.6 x 5.5 x 6.4 cm. = volume: 195 mL. Echogenicity within normal limits. No mass or hydronephrosis visualized. Bladder: Decompressed Other: Prostate is mildly prominent indenting upon the inferior aspect of bladder. IMPRESSION: Right renal cyst. No other focal abnormality is noted. Electronically Signed   By: Inez Catalina M.D.   On: 08/28/2019 03:21   Dg Chest Portable 1 View  Result Date: 08/27/2019 CLINICAL DATA:  Shortness of breath, COVID positive EXAM: PORTABLE CHEST 1 VIEW COMPARISON:  08/16/2009 FINDINGS: Left upper lobe and bilateral lower lobe opacities, compatible with pneumonia in this patient with known COVID. Possible small left pleural effusion. No pneumothorax. The heart is top-normal in size.  Thoracic aortic atherosclerosis. Suspected moderate hiatal hernia. IMPRESSION: Multifocal pneumonia in this patient with known COVID. Possible small left pleural effusion. Thoracic aortic atherosclerosis. Electronically Signed   By: Julian Hy M.D.   On: 08/27/2019 19:19       LOS: 2 days   [facility] Pager on [email or web address]  08/29/2019, 10:33 AM

## 2019-08-29 NOTE — Progress Notes (Signed)
Initial Nutrition Assessment  DOCUMENTATION CODES:   Not applicable  INTERVENTION:   Ensure Max po daily, each supplement provides 150 kcal and 30 grams of protein.   Pt receiving Hormel Shake daily with Breakfast which provides 520 kcals and 22 g of protein and Magic cup BID with lunch and dinner, each supplement provides 290 kcal and 9 grams of protein, automatically on meal trays to optimize nutritional intake.   Liberalize diet to Regular    NUTRITION DIAGNOSIS:   Increased nutrient needs related to acute illness(COVID-19) as evidenced by estimated needs.  GOAL:   Patient will meet greater than or equal to 90% of their needs  MONITOR:   PO intake, Supplement acceptance  REASON FOR ASSESSMENT:   Malnutrition Screening Tool    ASSESSMENT:   Pt with PMH of peptic ulcer, hypothyroidism, and HTN who was dx with COVID-19 10 days ago who is now admitted with PNA due to COVID-19.    Pt requiring supplemental O2 and has SOB with exertion.  Meal Completion: 90% 2 L O2 via [place], O2 sats 84-96%   Medications reviewed  Labs reviewed    NUTRITION - FOCUSED PHYSICAL EXAM:  Deferred   Diet Order:   Diet Order            Diet regular Room service appropriate? Yes; Fluid consistency: Thin  Diet effective now              EDUCATION NEEDS:   No education needs have been identified at this time  Skin:  Skin Assessment: Reviewed RN Assessment  Last BM:  11/26  Height:   Ht Readings from Last 1 Encounters:  08/28/19 5\' 8"  (1.727 m)    Weight:   Wt Readings from Last 1 Encounters:  08/28/19 76.3 kg    Ideal Body Weight:  70 kg  BMI:  Body mass index is 25.58 kg/m.  Estimated Nutritional Needs:   Kcal:  2100-2300  Protein:  110-125 grams  Fluid:  > 2 L/day  Maylon Peppers RD, LDN, CNSC [phone] Pager [phone] After Hours Pager

## 2019-08-30 LAB — COMPREHENSIVE METABOLIC PANEL
ALT: 31 U/L (ref 0–44)
AST: 36 U/L (ref 15–41)
Albumin: 2.9 g/dL — ABNORMAL LOW (ref 3.5–5.0)
Alkaline Phosphatase: 53 U/L (ref 38–126)
Anion gap: 11 (ref 5–15)
BUN: 45 mg/dL — ABNORMAL HIGH (ref 8–23)
CO2: 24 mmol/L (ref 22–32)
Calcium: 8.8 mg/dL — ABNORMAL LOW (ref 8.9–10.3)
Chloride: 102 mmol/L (ref 98–111)
Creatinine, Ser: 1.24 mg/dL (ref 0.61–1.24)
GFR calc Af Amer: >60 mL/min (ref >60)
GFR calc non Af Amer: 57 mL/min — ABNORMAL LOW (ref >60)
Glucose, Bld: 133 mg/dL — ABNORMAL HIGH (ref 70–99)
Potassium: 4.3 mmol/L (ref 3.5–5.1)
Sodium: 137 mmol/L (ref 135–145)
Total Bilirubin: 0.3 mg/dL (ref 0.3–1.2)
Total Protein: 6.6 g/dL (ref 6.5–8.1)

## 2019-08-30 LAB — CBC WITH DIFFERENTIAL/PLATELET
Abs Immature Granulocytes: 0.07 10*3/uL (ref 0.00–0.07)
Basophils Absolute: 0 10*3/uL (ref 0.0–0.1)
Basophils Relative: 0 %
Eosinophils Absolute: 0 10*3/uL (ref 0.0–0.5)
Eosinophils Relative: 0 %
HCT: 40 % (ref 39.0–52.0)
Hemoglobin: 13 g/dL (ref 13.0–17.0)
Immature Granulocytes: 1 %
Lymphocytes Relative: 12 %
Lymphs Abs: 1 10*3/uL (ref 0.7–4.0)
MCH: 30.2 pg (ref 26.0–34.0)
MCHC: 32.5 g/dL (ref 30.0–36.0)
MCV: 93 fL (ref 80.0–100.0)
Monocytes Absolute: 0.6 10*3/uL (ref 0.1–1.0)
Monocytes Relative: 7 %
Neutro Abs: 6.9 10*3/uL (ref 1.7–7.7)
Neutrophils Relative %: 80 %
Platelets: 366 10*3/uL (ref 150–400)
RBC: 4.3 MIL/uL (ref 4.22–5.81)
RDW: 13.2 % (ref 11.5–15.5)
WBC: 8.5 10*3/uL (ref 4.0–10.5)
nRBC: 0 % (ref 0.0–0.2)

## 2019-08-30 LAB — MAGNESIUM: Magnesium: 1.9 mg/dL (ref 1.7–2.4)

## 2019-08-30 LAB — FERRITIN: Ferritin: 381 ng/mL — ABNORMAL HIGH (ref 24–336)

## 2019-08-30 LAB — D-DIMER, QUANTITATIVE: D-Dimer, Quant: 7.48 ug/mL-FEU — ABNORMAL HIGH (ref 0.00–0.50)

## 2019-08-30 LAB — C-REACTIVE PROTEIN: CRP: 6.5 mg/dL — ABNORMAL HIGH (ref <1.0)

## 2019-08-30 MED ORDER — IPRATROPIUM-ALBUTEROL 20-100 MCG/ACT IN AERS
2.0000 | INHALATION_SPRAY | Freq: Four times a day (QID) | RESPIRATORY_TRACT | Status: DC
  Administered 2019-08-30 – 2019-09-02 (×14): 2 via RESPIRATORY_TRACT
  Filled 2019-08-30: qty 4

## 2019-08-30 MED ORDER — ENOXAPARIN SODIUM 40 MG/0.4ML ~~LOC~~ SOLN
40.0000 mg | Freq: Two times a day (BID) | SUBCUTANEOUS | Status: DC
  Administered 2019-08-30: 40 mg via SUBCUTANEOUS
  Filled 2019-08-30: qty 0.4

## 2019-08-30 MED ORDER — ENOXAPARIN SODIUM 40 MG/0.4ML ~~LOC~~ SOLN
40.0000 mg | Freq: Once | SUBCUTANEOUS | Status: AC
  Administered 2019-08-30: 40 mg via SUBCUTANEOUS
  Filled 2019-08-30: qty 0.4

## 2019-08-30 NOTE — Progress Notes (Signed)
ANTICOAGULATION CONSULT NOTE - Initial Consult  Pharmacy Consult for Lovenox Indication: VTE prophylaxis  Allergies  Allergen Reactions  . Morphine And Related Nausea Only    Patient Measurements: Height: 5\' 8"  (172.7 cm) Weight: 168 lb 3.4 oz (76.3 kg) IBW/kg (Calculated) : 68.4  Vital Signs: Temp: 97.8 F (36.6 C) (11/28 0734) Temp Source: Oral (11/28 0734) BP: 120/65 (11/28 0734) Pulse Rate: 44 (11/28 0734)  Labs: Recent Labs    08/28/19 0637 08/29/19 0020 08/30/19 0052  HGB 14.5 13.7 13.0  HCT 44.1 41.7 40.0  PLT 277 308 366  CREATININE 1.42* 1.31* 1.24    Estimated Creatinine Clearance: 51.3 mL/min (by C-G formula based on SCr of 1.24 mg/dL).   Medical History: Past Medical History:  Diagnosis Date  . Acute renal failure ([place])   . Carotid bruit   . Claudication ([place])   . Duodenal stricture   . GERD (gastroesophageal reflux disease)   . Hiatal hernia   . Hypertension   . Hypothyroidism   . Leriche's syndrome ([place])   . PVD (peripheral vascular disease) ([facility])    KNOWN AORTIC OCCLUSION  . Pyloric ulcer     Medications:  Medications Prior to Admission  Medication Sig Dispense Refill Last Dose  . amLODipine (NORVASC) 5 MG tablet Take 5 mg by mouth daily.     08/27/2019 at Unknown time  . chlorpheniramine-HYDROcodone (TUSSIONEX) 10-8 MG/5ML SUER Take 5 mLs by mouth every 12 (twelve) hours as needed for cough.   2 days ago  . cholecalciferol (VITAMIN D3) 25 MCG (1000 UT) tablet Take 2,000 Units by mouth daily.   08/27/2019 at Unknown time  . fish oil-omega-3 fatty acids 1000 MG capsule Take 1 g by mouth daily.     Past Week at Unknown time  . ipratropium (ATROVENT) 0.06 % nasal spray Place 2 sprays into both nostrils 4 (four) times daily.   2 days ago   . levothyroxine (SYNTHROID) 137 MCG tablet Take 137 mcg by mouth daily before breakfast.   08/27/2019 at Unknown time  . lisinopril-hydrochlorothiazide (PRINZIDE,ZESTORETIC) 20-12.5 MG per tablet Take 2  tablets by mouth daily.     08/27/2019 at Unknown time  . pantoprazole (PROTONIX) 40 MG tablet Take 40 mg by mouth daily.     08/27/2019 at Unknown time  . Calcium Carbonate-Vitamin D (CALCIUM + D PO) Take by mouth daily.         Assessment: 79 YOM with COVID-19 pneumonia on SQ heparin 5000 units q 8 hours now with D-dimer of 7.48. Pharmacy consulted to start high dose prophylactic lovenox dose per COVID protocol. SCr wnl   Goal of Therapy:  VTE prophylaxis Monitor platelets by anticoagulation protocol: Yes   Plan:  -Stop SQ heparin -Start Lovenox 40 mg BID  -Monitor CBC, renal fx and s/s of bleeding  -Pharmacy to sign off and monitor peripherally   Albertina Parr, PharmD., BCPS Clinical Pharmacist Clinical phone for 08/30/19 until 5pm: [phone]

## 2019-08-30 NOTE — Progress Notes (Signed)
PROGRESS NOTE  Nicholas [name] ZOX:[id number] DOB: 01/02/1946 DOA: 08/27/2019  PCP: System, Pcp Not In  Brief History/Interval Summary: 73 year old gentleman with history of peptic ulcer, hypothyroidism hypertension. Patient and his wife were diagnosed with Covid virus infection about 10 days ago.  They were managing at home.  Subsequently started having worsening symptoms.  Started feeling short of breath.  His wife is also experiencing similar symptoms.  They both presented to the emergency department.  He was found to be hypoxic.  Chest x-ray showed multifocal pneumonia.  He was hospitalized for further management.  Reason for Visit: Acute respiratory failure with hypoxia.  Pneumonia due to COVID-19.  Consultants: None  Procedures: None  Antibiotics: Anti-infectives (From admission, onward)   Start     Dose/Rate Route Frequency Ordered Stop   08/28/19 1830  remdesivir 100 mg in sodium chloride 0.9 % 250 mL IVPB     100 mg 500 mL/hr over 30 Minutes Intravenous Daily 08/27/19 2228 09/01/19 0959   08/27/19 2230  remdesivir 200 mg in sodium chloride 0.9 % 250 mL IVPB     200 mg 500 mL/hr over 30 Minutes Intravenous Once 08/27/19 2141 08/27/19 2359      Subjective/Interval History: Patient feels worn out this morning.  Reports not feeling any worse compared to yesterday but no better either.  Seems to be coughing less.  Occasional chest pain with cough.  None otherwise.  No nausea vomiting.  No dizziness or lightheadedness.     Assessment/Plan:  Acute Hypoxic Resp. Failure/Pneumonia due to COVID-19  COVID-19 Labs  Recent Labs    08/28/19 0637 08/29/19 0020 08/30/19 0052  DDIMER 2.95* 3.04* 7.48*  FERRITIN 460* 625* 381*  CRP 15.3* 11.2* 6.5*    Lab Results  Component Value Date   SARSCOV2NAA Detected (A) 08/22/2019     Objective findings: Fever: Remains afebrile Oxygen requirements: Nasal cannula.  3 L/min.  Saturating in the early 90s.    COVID 19  Therapeutics: Antibacterials: None Remdesivir: Day 4 Steroids: Dexamethasone 6 mg daily Diuretics: Not on scheduled basis Actemra: Not given yet Convalescent Plasma: Not given yet Vitamin C and Zinc: Will initiate PUD Prophylaxis: On Protonix DVT Prophylaxis: Lovenox twice daily  Patient remains stable for the most part from a respiratory standpoint.  Continues to require supplemental oxygen.  His CRP is improved to 6.5.  D-dimer noted to be higher today at 7.48.  We will put him on higher dose DVT prophylaxis.  No leg swelling noted.  Hopefully D-dimer will improve.  If it does not and if his respiratory status does not improve then may have to consider evaluation for VTE.  Would be cautious considering CT angiogram due to his recent renal failure.  Continue to monitor for now.  Continue mobilization, incentive spirometry.  Prone positioning as much as possible.  Add inhalers.  Acute renal failure Hypovolemia from COVID-19.  Patient was also on ACE inhibitor and HCTZ which could have also contributed.  These medications have been held.  Patient was gently hydrated.  Renal function improved.  Creatinine down to 1.24 today from a peak of 1.99 at admission.  Monitor urine output.  Recheck labs tomorrow.    Essential hypertension Patient on amlodipine which is being continued.  Lisinopril and HCTZ on hold.  Blood pressure is reasonably well controlled.  Hypothyroidism Continue with levothyroxine.  DVT Prophylaxis: Lovenox twice daily Code Status: Full code Family Communication: Discussed with patient.  Son being updated as well. Disposition Plan: Out of  bed to chair.  Continue current management.  Hopefully home when ready for discharge.   Medications:  Scheduled: . amLODipine  10 mg Oral Daily  . cholecalciferol  2,000 Units Oral Daily  . dexamethasone (DECADRON) injection  6 mg Intravenous Q24H  . enoxaparin (LOVENOX) injection  40 mg Subcutaneous Once  . [START ON 08/31/2019]  enoxaparin (LOVENOX) injection  40 mg Subcutaneous BID  . folic acid  1 mg Oral Daily  . Ipratropium-Albuterol  2 puff Inhalation QID  . levothyroxine  137 mcg Oral QAC breakfast  . multivitamin with minerals  1 tablet Oral Daily  . pantoprazole  40 mg Oral Daily  . Ensure Max Protein  11 oz Oral Daily  . thiamine  100 mg Oral Daily  . vitamin C  500 mg Oral Daily  . zinc sulfate  220 mg Oral Daily   Continuous: . remdesivir 100 mg in NS 250 mL 100 mg (08/29/19 1030)   OHY:[id number], guaiFENesin-dextromethorphan, hydrALAZINE   Objective:  Vital Signs  Vitals:   08/29/19 1603 08/29/19 2000 08/30/19 0400 08/30/19 0734  BP: 137/73 (!) 147/71 133/73 120/65  Pulse: 63   (!) 44  Resp: 19 20  20   Temp: 98 F (36.7 C) 98.5 F (36.9 C) 97.9 F (36.6 C) 97.8 F (36.6 C)  TempSrc: Oral Oral Oral Oral  SpO2: 93%   94%  Weight:      Height:        Intake/Output Summary (Last 24 hours) at 08/30/2019 0948 Last data filed at 08/29/2019 2000 Gross per 24 hour  Intake 490 ml  Output 300 ml  Net 190 ml   Filed Weights   08/28/19 1733  Weight: 76.3 kg    General appearance: Awake alert.  In no distress.  Noted to be slightly fatigued today. Resp: Mildly tachypneic at rest.  Coarse breath sound bilaterally.  Occasional wheezing.  Crackles at the bases.  No rhonchi.   Cardio: S1-S2 is normal regular.  No S3-S4.  No rubs murmurs or bruit GI: Abdomen is soft.  Nontender nondistended.  Bowel sounds are present normal.  No masses organomegaly Extremities: No edema.  Full range of motion of lower extremities. Neurologic: Alert and oriented x3.  No focal neurological deficits.     Lab Results:  Data Reviewed: I have personally reviewed following labs and imaging studies  CBC: Recent Labs  Lab 08/27/19 1827 08/28/19 0637 08/29/19 0020 08/30/19 0052  WBC 8.5 7.5 5.7 8.5  NEUTROABS 7.5 6.7 4.2 6.9  HGB 15.0 14.5 13.7 13.0  HCT 45.5 44.1 41.7 40.0  MCV 92.5 93.4 92.3 93.0   PLT 296 277 308 462    Basic Metabolic Panel: Recent Labs  Lab 08/27/19 1827 08/28/19 0637 08/29/19 0020 08/30/19 0052  NA 136 137 138 137  K 3.8 4.1 4.3 4.3  CL 97* 102 102 102  CO2 25 24 24 24   GLUCOSE 140* 145* 127* 133*  BUN 58* 37* 41* 45*  CREATININE 1.99* 1.42* 1.31* 1.24  CALCIUM 9.2 8.6* 8.9 8.8*  MG  --  2.0 2.0 1.9  PHOS  --  3.0  --   --     GFR: Estimated Creatinine Clearance: 51.3 mL/min (by C-G formula based on SCr of 1.24 mg/dL).  Liver Function Tests: Recent Labs  Lab 08/27/19 1827 08/28/19 0637 08/29/19 0020 08/30/19 0052  AST 44* 45* 44* 36  ALT 28 25 26 31   ALKPHOS 59 54 50 53  BILITOT 0.4 0.4 0.7 0.3  PROT 8.0 6.9 6.8 6.6  ALBUMIN 3.4* 3.0* 3.2* 2.9*     Anemia Panel: Recent Labs    08/29/19 0020 08/30/19 0052  FERRITIN 625* 381*    Recent Results (from the past 240 hour(s))  Novel Coronavirus, NAA ([facility])     Status: Abnormal   Collection Time: 08/22/19 12:00 AM   Specimen: Nasopharyngeal(NP) swabs in vial transport medium   NASOPHARYNGE  TESTING  Result Value Ref Range Status   SARS-CoV-2, NAA Detected (A) Not Detected Final    Comment: This nucleic acid amplification test was developed and its performance characteristics determined by [employer]. Nucleic acid amplification tests include PCR and TMA. This test has not been FDA cleared or approved. This test has been authorized by FDA under an Emergency Use Authorization (EUA). This test is only authorized for the duration of time the declaration that circumstances exist justifying the authorization of the emergency use of in vitro diagnostic tests for detection of SARS-CoV-2 virus and/or diagnosis of COVID-19 infection under section 564(b)(1) of the Act, 21 U.S.C. 093GHW-2(X) (1), unless the authorization is terminated or revoked sooner. When diagnostic testing is negative, the possibility of a false negative result should be considered in the context of a  patient's recent exposures and the presence of clinical signs and symptoms consistent with COVID-19. An individual without symptoms of COVID-19 and who is not shedding SARS-CoV-2 virus would  expect to have a negative (not detected) result in this assay.       Radiology Studies: No results found.     LOS: 3 days   Eulla Kochanowski [employer] on [email or web address]  08/30/2019, 9:48 AM

## 2019-08-31 ENCOUNTER — Inpatient Hospital Stay (HOSPITAL_COMMUNITY): Payer: Medicare HMO

## 2019-08-31 DIAGNOSIS — I2699 Other pulmonary embolism without acute cor pulmonale: Secondary | ICD-10-CM

## 2019-08-31 LAB — CBC WITH DIFFERENTIAL/PLATELET
Abs Immature Granulocytes: 0.18 10*3/uL — ABNORMAL HIGH (ref 0.00–0.07)
Basophils Absolute: 0 10*3/uL (ref 0.0–0.1)
Basophils Relative: 0 %
Eosinophils Absolute: 0 10*3/uL (ref 0.0–0.5)
Eosinophils Relative: 0 %
HCT: 41.2 % (ref 39.0–52.0)
Hemoglobin: 13.4 g/dL (ref 13.0–17.0)
Immature Granulocytes: 2 %
Lymphocytes Relative: 11 %
Lymphs Abs: 1 10*3/uL (ref 0.7–4.0)
MCH: 30.1 pg (ref 26.0–34.0)
MCHC: 32.5 g/dL (ref 30.0–36.0)
MCV: 92.6 fL (ref 80.0–100.0)
Monocytes Absolute: 0.8 10*3/uL (ref 0.1–1.0)
Monocytes Relative: 9 %
Neutro Abs: 7 10*3/uL (ref 1.7–7.7)
Neutrophils Relative %: 78 %
Platelets: 391 10*3/uL (ref 150–400)
RBC: 4.45 MIL/uL (ref 4.22–5.81)
RDW: 13 % (ref 11.5–15.5)
WBC: 8.9 10*3/uL (ref 4.0–10.5)
nRBC: 0 % (ref 0.0–0.2)

## 2019-08-31 LAB — COMPREHENSIVE METABOLIC PANEL
ALT: 29 U/L (ref 0–44)
AST: 26 U/L (ref 15–41)
Albumin: 2.8 g/dL — ABNORMAL LOW (ref 3.5–5.0)
Alkaline Phosphatase: 49 U/L (ref 38–126)
Anion gap: 9 (ref 5–15)
BUN: 35 mg/dL — ABNORMAL HIGH (ref 8–23)
CO2: 25 mmol/L (ref 22–32)
Calcium: 9.1 mg/dL (ref 8.9–10.3)
Chloride: 104 mmol/L (ref 98–111)
Creatinine, Ser: 1.04 mg/dL (ref 0.61–1.24)
GFR calc Af Amer: >60 mL/min (ref >60)
GFR calc non Af Amer: >60 mL/min (ref >60)
Glucose, Bld: 137 mg/dL — ABNORMAL HIGH (ref 70–99)
Potassium: 4.2 mmol/L (ref 3.5–5.1)
Sodium: 138 mmol/L (ref 135–145)
Total Bilirubin: 0.5 mg/dL (ref 0.3–1.2)
Total Protein: 6.4 g/dL — ABNORMAL LOW (ref 6.5–8.1)

## 2019-08-31 LAB — MAGNESIUM: Magnesium: 1.9 mg/dL (ref 1.7–2.4)

## 2019-08-31 LAB — HEPARIN LEVEL (UNFRACTIONATED): Heparin Unfractionated: 2.02 IU/mL — ABNORMAL HIGH (ref 0.30–0.70)

## 2019-08-31 LAB — FERRITIN: Ferritin: 226 ng/mL (ref 24–336)

## 2019-08-31 LAB — C-REACTIVE PROTEIN: CRP: 4.6 mg/dL — ABNORMAL HIGH (ref <1.0)

## 2019-08-31 LAB — D-DIMER, QUANTITATIVE: D-Dimer, Quant: 7.83 ug/mL-FEU — ABNORMAL HIGH (ref 0.00–0.50)

## 2019-08-31 MED ORDER — HEPARIN (PORCINE) 25000 UT/250ML-% IV SOLN
1350.0000 [IU]/h | INTRAVENOUS | Status: DC
  Administered 2019-08-31: 1350 [IU]/h via INTRAVENOUS
  Filled 2019-08-31: qty 250

## 2019-08-31 MED ORDER — HEPARIN BOLUS VIA INFUSION
5000.0000 [IU] | Freq: Once | INTRAVENOUS | Status: DC
  Administered 2019-08-31: 5000 [IU] via INTRAVENOUS
  Filled 2019-08-31: qty 5000

## 2019-08-31 MED ORDER — IOHEXOL 350 MG/ML SOLN
80.0000 mL | Freq: Once | INTRAVENOUS | Status: AC | PRN
  Administered 2019-08-31: 80 mL via INTRAVENOUS

## 2019-08-31 MED ORDER — HEPARIN (PORCINE) 25000 UT/250ML-% IV SOLN
950.0000 [IU]/h | INTRAVENOUS | Status: DC
  Administered 2019-08-31: 1150 [IU]/h via INTRAVENOUS
  Filled 2019-08-31: qty 250

## 2019-08-31 NOTE — Progress Notes (Signed)
PROGRESS NOTE  Nicholas [name]:[id number] DOB: 1946/07/22 DOA: 08/27/2019  PCP: System, Pcp Not In  Brief History/Interval Summary: 73 year old gentleman with history of peptic ulcer, hypothyroidism hypertension. Patient and his wife were diagnosed with Covid virus infection about 10 days ago.  They were managing at home.  Subsequently started having worsening symptoms.  Started feeling short of breath.  His wife is also experiencing similar symptoms.  They both presented to the emergency department.  He was found to be hypoxic.  Chest x-ray showed multifocal pneumonia.  He was hospitalized for further management.  Reason for Visit: Acute respiratory failure with hypoxia.  Pneumonia due to COVID-19.  Consultants: None  Procedures: None  Antibiotics: Anti-infectives (From admission, onward)   Start     Dose/Rate Route Frequency Ordered Stop   08/28/19 1830  remdesivir 100 mg in sodium chloride 0.9 % 250 mL IVPB     100 mg 500 mL/hr over 30 Minutes Intravenous Daily 08/27/19 2228 09/01/19 0959   08/27/19 2230  remdesivir 200 mg in sodium chloride 0.9 % 250 mL IVPB     200 mg 500 mL/hr over 30 Minutes Intravenous Once 08/27/19 2141 08/27/19 2359      Subjective/Interval History: Patient continues to feel poorly even though his oxygen requirements have gone down.  He is still getting very short of breath even with minimal exertion.  Complains of some soreness in the chest when he coughs.  Denies any blood in the sputum.  No nausea vomiting.     Assessment/Plan:  Acute Hypoxic Resp. Failure/Pneumonia due to COVID-19  COVID-19 Labs  Recent Labs    08/29/19 0020 08/30/19 0052 08/31/19 0043  DDIMER 3.04* 7.48* 7.83*  FERRITIN 625* 381* 226  CRP 11.2* 6.5* 4.6*    Lab Results  Component Value Date   SARSCOV2NAA Detected (A) 08/22/2019     Objective findings: Fever: Remains afebrile Oxygen requirements: Nasal cannula.  1 L/min.  Saturating in the early 90s.      COVID 19 Therapeutics: Antibacterials: None Remdesivir: Day 5 Steroids: Dexamethasone 6 mg daily Diuretics: Not on scheduled basis Actemra: Not given yet Convalescent Plasma: Not given yet Vitamin C and Zinc: Will initiate PUD Prophylaxis: On Protonix DVT Prophylaxis: Lovenox to be changed over to IV heparin  Patient seems to be stable from a respiratory standpoint.  His oxygen requirements have improved.  However he still feels quite dyspneic even with minimal exertion.  Also experiencing some chest discomfort with coughing and deep breathing.  His D-dimer is elevated.  Has not improved plus his symptoms have not really improved much.  So CT scan was discussed with the patient.  He is agreeable.  We will proceed with CT angiogram.  See below.  Inflammatory markers have improved.  CRP is down to 4.6.  Ferritin 226.  Continue with remdesivir and steroids.  Incentive spirometry, mobilization, prone positioning as much as possible.  Acute pulmonary embolism Due to lack of improvement in symptoms despite improvement and oxygen requirements suspicion for pulmonary embolism was high.  This was discussed in detail with patient.  We were hesitant yesterday to proceed due to his renal failure at presentation.  However his renal function has been normal for the last 2 days.  He was told about the risks of contrast nephropathy.  However the benefits seem to be higher than the risks at this time.  CT scan was done.  It does show bilateral PE.  Patient to be started on IV heparin.  Reason  for the PE is infection due to COVID-19.  Acute renal failure This was due to hypovolemia from COVID-19.  Patient was gently hydrated.  His ACE inhibitor and HCTZ were held.  Renal function improved.  Normal for the last 2 days.  Monitor urine output.  Recheck a creatinine now that he is received contrast.    Essential hypertension Patient on amlodipine which is being continued.  Lisinopril and HCTZ on hold.  Blood  pressure is reasonably well controlled.  Hypothyroidism Continue with levothyroxine.  DVT Prophylaxis: IV heparin Code Status: Full code Family Communication: Discussed with patient and his wife.  Son being updated on a daily basis. Disposition Plan: Out of bed to chair.  Continue current management.  Hopefully home when ready for discharge.   Medications:  Scheduled: . amLODipine  10 mg Oral Daily  . cholecalciferol  2,000 Units Oral Daily  . dexamethasone (DECADRON) injection  6 mg Intravenous Q24H  . folic acid  1 mg Oral Daily  . heparin  5,000 Units Intravenous Once  . Ipratropium-Albuterol  2 puff Inhalation QID  . levothyroxine  137 mcg Oral QAC breakfast  . multivitamin with minerals  1 tablet Oral Daily  . pantoprazole  40 mg Oral Daily  . Ensure Max Protein  11 oz Oral Daily  . thiamine  100 mg Oral Daily  . vitamin C  500 mg Oral Daily  . zinc sulfate  220 mg Oral Daily   Continuous: . heparin    . remdesivir 100 mg in NS 250 mL 100 mg (08/30/19 1100)   [name]:[id number]:acetaminophen, guaiFENesin-dextromethorphan, hydrALAZINE   Objective:  Vital Signs  Vitals:   08/30/19 1950 08/31/19 0000 08/31/19 0404 08/31/19 0718  BP: 138/75  113/80 134/74  Pulse: 75 75 70 78  Resp: 20 (!) 22 20 20   Temp: 98 F (36.7 C)  98 F (36.7 C) 97.8 F (36.6 C)  TempSrc: Oral  Oral Oral  SpO2: 95% 95% 95% 95%  Weight:      Height:        Intake/Output Summary (Last 24 hours) at 08/31/2019 1006 Last data filed at 08/31/2019 0400 Gross per 24 hour  Intake 1060 ml  Output 1675 ml  Net -615 ml   Filed Weights   08/28/19 1733  Weight: 76.3 kg    General appearance: Awake alert.  In no distress.  Fatigued Resp: Coarse breath sound bilaterally.  Crackles at the bases.  No wheezing or rhonchi.  More noted to be tachypneic at rest.  No use of accessory muscles.   Cardio: S1-S2 tachycardic, no S3-S4.  No rubs murmurs or bruit. GI: Abdomen is soft.  Nontender nondistended.  Bowel  sounds are present normal.  No masses organomegaly Extremities: No edema.  Full range of motion of lower extremities. Neurologic: Alert and oriented x3.  No focal neurological deficits.      Lab Results:  Data Reviewed: I have personally reviewed following labs and imaging studies  CBC: Recent Labs  Lab 08/27/19 1827 08/28/19 0637 08/29/19 0020 08/30/19 0052 08/31/19 0043  WBC 8.5 7.5 5.7 8.5 8.9  NEUTROABS 7.5 6.7 4.2 6.9 7.0  HGB 15.0 14.5 13.7 13.0 13.4  HCT 45.5 44.1 41.7 40.0 41.2  MCV 92.5 93.4 92.3 93.0 92.6  PLT 296 277 308 366 391    Basic Metabolic Panel: Recent Labs  Lab 08/27/19 1827 08/28/19 0637 08/29/19 0020 08/30/19 0052 08/31/19 0043  NA 136 137 138 137 138  K 3.8 4.1 4.3 4.3 4.2  CL 97* 102 102 102 104  CO2 GLUCOSE 140* 145* 127* 133* 137*  BUN 58* 37* 41* 45* 35*  CREATININE 1.99* 1.42* 1.31* 1.24 1.04  CALCIUM 9.2 8.6* 8.9 8.8* 9.1  MG  --  2.0 2.0 1.9 1.9  PHOS  --  3.0  --   --   --     GFR: Estimated Creatinine Clearance: 61.2 mL/min (by C-G formula based on SCr of 1.04 mg/dL).  Liver Function Tests: Recent Labs  Lab 08/27/19 1827 08/28/19 0637 08/29/19 0020 08/30/19 0052 08/31/19 0043  AST 44* 45* 44* 36 26  ALT ALKPHOS 59 54 50 53 49  BILITOT 0.4 0.4 0.7 0.3 0.5  PROT 8.0 6.9 6.8 6.6 6.4*  ALBUMIN 3.4* 3.0* 3.2* 2.9* 2.8*     Anemia Panel: Recent Labs    08/30/19 0052 08/31/19 0043  FERRITIN 381* 226    Recent Results (from the past 240 hour(s))  Novel Coronavirus, NAA ([facility])     Status: Abnormal   Collection Time: 08/22/19 12:00 AM   Specimen: Nasopharyngeal(NP) swabs in vial transport medium   NASOPHARYNGE  TESTING  Result Value Ref Range Status   SARS-CoV-2, NAA Detected (A) Not Detected Final    Comment: This nucleic acid amplification test was developed and its performance characteristics determined by [employer]. Nucleic acid amplification tests include PCR  and TMA. This test has not been FDA cleared or approved. This test has been authorized by FDA under an Emergency Use Authorization (EUA). This test is only authorized for the duration of time the declaration that circumstances exist justifying the authorization of the emergency use of in vitro diagnostic tests for detection of SARS-CoV-2 virus and/or diagnosis of COVID-19 infection under section 564(b)(1) of the Act, 21 U.S.C. 161WRU-0(A) (1), unless the authorization is terminated or revoked sooner. When diagnostic testing is negative, the possibility of a false negative result should be considered in the context of a patient's recent exposures and the presence of clinical signs and symptoms consistent with COVID-19. An individual without symptoms of COVID-19 and who is not shedding SARS-CoV-2 virus would  expect to have a negative (not detected) result in this assay.       Radiology Studies: Ct Angio Chest Pe W Or Wo Contrast  Result Date: 08/31/2019 CLINICAL DATA:  COVID-19 pneumonia, respiratory failure and elevated D-dimer. EXAM: CT ANGIOGRAPHY CHEST WITH CONTRAST TECHNIQUE: Multidetector CT imaging of the chest was performed using the standard protocol during bolus administration of intravenous contrast. Multiplanar CT image reconstructions and MIPs were obtained to evaluate the vascular anatomy. CONTRAST:  80mL OMNIPAQUE IOHEXOL 350 MG/ML SOLN COMPARISON:  None. FINDINGS: Cardiovascular: Bilateral nonocclusive acute pulmonary embolism noted in the right middle lobe, right lower lobe, right upper lobe, left upper lobe, lingula and left lower lobe. No evidence of right heart strain by CTA. Central pulmonary arteries are normal in caliber and there is no evidence of saddle pulmonary embolism. The heart size is normal. Calcified coronary artery plaque in a 3 vessel distribution. No pericardial fluid identified. The thoracic aorta is normal in caliber. Mediastinum/Nodes: No enlarged  mediastinal, hilar, or axillary lymph nodes. Thyroid gland, trachea, and esophagus demonstrate no significant findings. Large hiatal hernia present. Lungs/Pleura: Scattered pulmonary infiltrates throughout both lungs consistent with known COVID-19 pneumonia. No pulmonary edema, pneumothorax or pleural effusions identified. Upper Abdomen: Diffuse hepatic steatosis in the visualized liver. Musculoskeletal: No chest wall abnormality. No acute or significant  osseous findings. Review of the MIP images confirms the above findings. IMPRESSION: 1. Bilateral nonocclusive acute pulmonary embolism throughout both lungs. No evidence of right heart strain by CTA. 2. Calcified coronary artery plaque in a 3 vessel distribution. 3. Large hiatal hernia. 4. Diffuse hepatic steatosis. These results will be called to the ordering clinician or representative by the Radiologist Assistant, and communication documented in the PACS or zVision Dashboard. Electronically Signed   By: Irish Lack M.D.   On: 08/31/2019 09:01   Dg Chest Port 1 View  Result Date: 08/31/2019 CLINICAL DATA:  COVID-19 positive. EXAM: PORTABLE CHEST 1 VIEW COMPARISON:  Chest x-ray dated 08/27/2019. FINDINGS: Borderline cardiomegaly, stable. Lungs are now clear. No pleural effusion or pneumothorax is seen. Osseous structures about the chest are unremarkable. IMPRESSION: 1. No active cardiopulmonary disease. No evidence of pneumonia or pulmonary edema on today's exam. 2. Stable borderline cardiomegaly. Electronically Signed   By: Bary Richard M.D.   On: 08/31/2019 06:24       LOS: 4 days   Kushal Saunders Rito Ehrlich  [facility] Pager on [email or web address]  08/31/2019, 10:06 AM

## 2019-08-31 NOTE — Progress Notes (Signed)
ANTICOAGULATION CONSULT NOTE - Initial Consult  Pharmacy Consult for Heparin Indication: pulmonary embolus  Allergies  Allergen Reactions  . Morphine And Related Nausea Only   Patient Measurements: Height: 5\' 8"  (172.7 cm) Weight: 168 lb 3.4 oz (76.3 kg) IBW/kg (Calculated) : 68.4 Heparin Dosing Weight: 76 kg  Vital Signs: Temp: 97.8 F (36.6 C) (11/29 0718) Temp Source: Oral (11/29 0718) BP: 134/74 (11/29 0718) Pulse Rate: 78 (11/29 0718)  Labs: Recent Labs    08/29/19 0020 08/30/19 0052 08/31/19 0043  HGB 13.7 13.0 13.4  HCT 41.7 40.0 41.2  PLT 308 366 391  CREATININE 1.31* 1.24 1.04   Estimated Creatinine Clearance: 61.2 mL/min (by C-G formula based on SCr of 1.04 mg/dL).  Medical History: Past Medical History:  Diagnosis Date  . Acute renal failure ([place])   . Carotid bruit   . Claudication ([place])   . Duodenal stricture   . GERD (gastroesophageal reflux disease)   . Hiatal hernia   . Hypertension   . Hypothyroidism   . Leriche's syndrome ([place])   . PVD (peripheral vascular disease) ([facility])    KNOWN AORTIC OCCLUSION  . Pyloric ulcer    Medications:  Scheduled:  . amLODipine  10 mg Oral Daily  . cholecalciferol  2,000 Units Oral Daily  . dexamethasone (DECADRON) injection  6 mg Intravenous Q24H  . folic acid  1 mg Oral Daily  . heparin  5,000 Units Intravenous Once  . Ipratropium-Albuterol  2 puff Inhalation QID  . levothyroxine  137 mcg Oral QAC breakfast  . multivitamin with minerals  1 tablet Oral Daily  . pantoprazole  40 mg Oral Daily  . Ensure Max Protein  11 oz Oral Daily  . thiamine  100 mg Oral Daily  . vitamin C  500 mg Oral Daily  . zinc sulfate  220 mg Oral Daily    Assessment: 73yo male with COVID-19 pneumonia and has been on SQ LMWH for DVT prophylaxis.  D.Dimer has been increasing and a CT was ordered which reveals bilateral PE without evidence of right heart strain.  We have been asked to initiate full dose heparin for him. His CBC is  stable (13.4/41.2) and Platelets at 391K.  He is without noted bleeding complications.  Goal of Therapy:  Heparin level 0.3-0.7 units/ml Monitor platelets by anticoagulation protocol: Yes   Plan:  Give 5000 units bolus x 1 Start heparin infusion at 1350 units/hr Check anti-Xa level in 6 hours and daily while on heparin Continue to monitor H&H and platelets  Rober Minion, PharmD., MS Clinical Pharmacist Pager:  [phone] Thank you for allowing pharmacy to be part of this patients care team.  08/31/2019,9:53 AM

## 2019-08-31 NOTE — Plan of Care (Signed)
Pt slept well during the night. Complained of mild headache, prn Tylenol given. Alert and oriented. Vitals stableO2 weaned to 1L . Minimal dyspnea on exertion and non-roductive cough noted, prn Robitussin given. Standby assist with ADLs. IV  SL. Unable to tolerate prone positioning overnight. No other issues, will monitor.   Problem: Education: Goal: Knowledge of risk factors and measures for prevention of condition will improve Outcome: Progressing   Problem: Coping: Goal: Psychosocial and spiritual needs will be supported Outcome: Progressing   Problem: Respiratory: Goal: Will maintain a patent airway Outcome: Progressing Goal: Complications related to the disease process, condition or treatment will be avoided or minimized Outcome: Progressing

## 2019-08-31 NOTE — Progress Notes (Signed)
ANTICOAGULATION CONSULT NOTE - Initial Consult  Pharmacy Consult for Heparin Indication: pulmonary embolus  Allergies  Allergen Reactions  . Morphine And Related Nausea Only   Patient Measurements: Height: 5\' 8"  (172.7 cm) Weight: 168 lb 3.4 oz (76.3 kg) IBW/kg (Calculated) : 68.4 Heparin Dosing Weight: 76 kg  Vital Signs: Temp: 98.2 F (36.8 C) (11/29 2015) Temp Source: Oral (11/29 2015) BP: 138/78 (11/29 2015) Pulse Rate: 76 (11/29 2015)  Labs: Recent Labs    08/29/19 0020 08/30/19 0052 08/31/19 0043 08/31/19 1705  HGB 13.7 13.0 13.4  --   HCT 41.7 40.0 41.2  --   PLT 308 366 391  --   HEPARINUNFRC  --   --   --  2.02*  CREATININE 1.31* 1.24 1.04  --    Estimated Creatinine Clearance: 61.2 mL/min (by C-G formula based on SCr of 1.04 mg/dL).  Medical History: Past Medical History:  Diagnosis Date  . Acute renal failure ([place])   . Carotid bruit   . Claudication ([place])   . Duodenal stricture   . GERD (gastroesophageal reflux disease)   . Hiatal hernia   . Hypertension   . Hypothyroidism   . Leriche's syndrome ([place])   . PVD (peripheral vascular disease) ([facility])    KNOWN AORTIC OCCLUSION  . Pyloric ulcer    Medications:  Scheduled:  . amLODipine  10 mg Oral Daily  . cholecalciferol  2,000 Units Oral Daily  . dexamethasone (DECADRON) injection  6 mg Intravenous Q24H  . folic acid  1 mg Oral Daily  . Ipratropium-Albuterol  2 puff Inhalation QID  . levothyroxine  137 mcg Oral QAC breakfast  . multivitamin with minerals  1 tablet Oral Daily  . pantoprazole  40 mg Oral Daily  . Ensure Max Protein  11 oz Oral Daily  . thiamine  100 mg Oral Daily  . vitamin C  500 mg Oral Daily  . zinc sulfate  220 mg Oral Daily    Assessment: 73yo male with COVID-19 pneumonia and has been on SQ LMWH for DVT prophylaxis.  D.Dimer has been increasing and a CT was ordered which reveals bilateral PE without evidence of right heart strain.  We have been asked to initiate full dose  heparin for him. His CBC is stable (13.4/41.2) and Platelets at 391K.  He is without noted bleeding complications.  His HL came back tonight at 2.02. This partly due to the effect of the SQ lovenox that he got last night. We will hold his rate for an hour and decrease rate.   Goal of Therapy:  Heparin level 0.3-0.7 units/ml Monitor platelets by anticoagulation protocol: Yes   Plan:   Hold heparin x1 hr Decrease rate to 1150 units/hr F/u with AM heparin level  Onnie Boer, PharmD, [place], AAHIVP, CPP Infectious Disease Pharmacist 08/31/2019 8:44 PM

## 2019-09-01 LAB — CBC WITH DIFFERENTIAL/PLATELET
Abs Immature Granulocytes: 0.16 10*3/uL — ABNORMAL HIGH (ref 0.00–0.07)
Basophils Absolute: 0 10*3/uL (ref 0.0–0.1)
Basophils Relative: 0 %
Eosinophils Absolute: 0 10*3/uL (ref 0.0–0.5)
Eosinophils Relative: 0 %
HCT: 39.9 % (ref 39.0–52.0)
Hemoglobin: 13.2 g/dL (ref 13.0–17.0)
Immature Granulocytes: 2 %
Lymphocytes Relative: 11 %
Lymphs Abs: 1 10*3/uL (ref 0.7–4.0)
MCH: 30.3 pg (ref 26.0–34.0)
MCHC: 33.1 g/dL (ref 30.0–36.0)
MCV: 91.5 fL (ref 80.0–100.0)
Monocytes Absolute: 0.8 10*3/uL (ref 0.1–1.0)
Monocytes Relative: 9 %
Neutro Abs: 6.7 10*3/uL (ref 1.7–7.7)
Neutrophils Relative %: 78 %
Platelets: 391 10*3/uL (ref 150–400)
RBC: 4.36 MIL/uL (ref 4.22–5.81)
RDW: 13.2 % (ref 11.5–15.5)
WBC: 8.7 10*3/uL (ref 4.0–10.5)
nRBC: 0 % (ref 0.0–0.2)

## 2019-09-01 LAB — COMPREHENSIVE METABOLIC PANEL
ALT: 29 U/L (ref 0–44)
AST: 21 U/L (ref 15–41)
Albumin: 3 g/dL — ABNORMAL LOW (ref 3.5–5.0)
Alkaline Phosphatase: 46 U/L (ref 38–126)
Anion gap: 7 (ref 5–15)
BUN: 31 mg/dL — ABNORMAL HIGH (ref 8–23)
CO2: 27 mmol/L (ref 22–32)
Calcium: 8.9 mg/dL (ref 8.9–10.3)
Chloride: 104 mmol/L (ref 98–111)
Creatinine, Ser: 0.97 mg/dL (ref 0.61–1.24)
GFR calc Af Amer: >60 mL/min (ref >60)
GFR calc non Af Amer: >60 mL/min (ref >60)
Glucose, Bld: 135 mg/dL — ABNORMAL HIGH (ref 70–99)
Potassium: 4.5 mmol/L (ref 3.5–5.1)
Sodium: 138 mmol/L (ref 135–145)
Total Bilirubin: 0.6 mg/dL (ref 0.3–1.2)
Total Protein: 6.3 g/dL — ABNORMAL LOW (ref 6.5–8.1)

## 2019-09-01 LAB — D-DIMER, QUANTITATIVE: D-Dimer, Quant: 2.81 ug/mL-FEU — ABNORMAL HIGH (ref 0.00–0.50)

## 2019-09-01 LAB — HEPARIN LEVEL (UNFRACTIONATED)
Heparin Unfractionated: 1.08 IU/mL — ABNORMAL HIGH (ref 0.30–0.70)
Heparin Unfractionated: 1.01 IU/mL — ABNORMAL HIGH (ref 0.30–0.70)

## 2019-09-01 LAB — MAGNESIUM: Magnesium: 2.1 mg/dL (ref 1.7–2.4)

## 2019-09-01 LAB — C-REACTIVE PROTEIN: CRP: 2.7 mg/dL — ABNORMAL HIGH (ref <1.0)

## 2019-09-01 MED ORDER — RIVAROXABAN 15 MG PO TABS
15.0000 mg | ORAL_TABLET | Freq: Two times a day (BID) | ORAL | Status: DC
  Administered 2019-09-01 – 2019-09-02 (×2): 15 mg via ORAL
  Filled 2019-09-01 (×4): qty 1

## 2019-09-01 MED ORDER — RIVAROXABAN 15 MG PO TABS
15.0000 mg | ORAL_TABLET | Freq: Once | ORAL | Status: AC
  Administered 2019-09-01: 15 mg via ORAL
  Filled 2019-09-01: qty 1

## 2019-09-01 MED ORDER — RIVAROXABAN 20 MG PO TABS: 20.0000 mg | ORAL_TABLET | Freq: Every day | ORAL | Status: DC

## 2019-09-01 NOTE — TOC Benefit Eligibility Note (Signed)
Transition of Care [facility]) Benefit Eligibility Note    Patient Details  Name: Nicholas [name] MRN: [id number] Date of Birth: 05-12-1946   Medication/Dose: Al[place]a Reasons  Covered?: Yes     Prescription Coverage Preferred Pharmacy: [facility] or [facility] with Person/Company/Phone Number:: [employer] [phone]  Co-Pay: $16.95 for starter pack/ $45 for 30 day  Prior Approval: No          [place] Phone Number: 09/01/2019, 3:40 PM

## 2019-09-01 NOTE — Progress Notes (Signed)
[place] for Heparin Indication: pulmonary embolus  Allergies  Allergen Reactions  . Morphine And Related Nausea Only   Patient Measurements: Height: 5\' 8"  (172.7 cm) Weight: 168 lb 3.4 oz (76.3 kg) IBW/kg (Calculated) : 68.4 Heparin Dosing Weight: 76 kg  Vital Signs: Temp: 98.2 F (36.8 C) (11/29 2015) Temp Source: Oral (11/29 2015) BP: 138/78 (11/29 2015) Pulse Rate: 76 (11/29 2015)  Labs: Recent Labs    08/30/19 0052 08/31/19 0043 08/31/19 1705 09/01/19 0312 09/01/19 0315  HGB 13.0 13.4  --   --  13.2  HCT 40.0 41.2  --   --  39.9  PLT 366 391  --   --  391  HEPARINUNFRC  --   --  2.02* 1.08*  --   CREATININE 1.24 1.04  --   --  0.97   Estimated Creatinine Clearance: 65.6 mL/min (by C-G formula based on SCr of 0.97 mg/dL).   Assessment: 73yo male with COVID-19 pneumonia and has been on SQ LMWH for DVT prophylaxis.  D.Dimer has been increasing and a CT was ordered which reveals bilateral PE without evidence of right heart strain.  We have been asked to initiate full dose heparin for him. His CBC is stable (13.4/41.2) and Platelets at 391K.  He is without noted bleeding complications.  His HL this am remains elevated at 1.08 units/ml  Hg 13.2, PTLC 391  No bleeding reported  Goal of Therapy:  Heparin level 0.3-0.7 units/ml Monitor platelets by anticoagulation protocol: Yes   Plan:  Decrease heparin rate to 950 units/hr Check heparin level 6 hours after rate change  Thanks for allowing pharmacy to be a part of this patient's care.  Excell Seltzer, PharmD Clinical Pharmacist  09/01/2019 6:39 AM

## 2019-09-01 NOTE — Progress Notes (Signed)
PROGRESS NOTE  Nicholas [name] ZOX:[id number] DOB: 11/09/1945 DOA: 08/27/2019  PCP: System, Pcp Not In  Brief History/Interval Summary: 73 year old gentleman with history of peptic ulcer, hypothyroidism hypertension. Patient and his wife were diagnosed with Covid virus infection about 10 days ago.  They were managing at home.  Subsequently started having worsening symptoms.  Started feeling short of breath.  His wife is also experiencing similar symptoms.  They both presented to the emergency department.  He was found to be hypoxic.  Chest x-ray showed multifocal pneumonia.  He was hospitalized for further management.  Reason for Visit: Acute respiratory failure with hypoxia.  Pneumonia due to COVID-19.  Consultants: None  Procedures: None  Antibiotics: Anti-infectives (From admission, onward)   Start     Dose/Rate Route Frequency Ordered Stop   08/28/19 1830  remdesivir 100 mg in sodium chloride 0.9 % 250 mL IVPB     100 mg 500 mL/hr over 30 Minutes Intravenous Daily 08/27/19 2228 08/31/19 1647   08/27/19 2230  remdesivir 200 mg in sodium chloride 0.9 % 250 mL IVPB     200 mg 500 mL/hr over 30 Minutes Intravenous Once 08/27/19 2141 08/27/19 2359      Subjective/Interval History: Patient states that he is starting to feel better.  Not as short of breath as he was 2 days ago.  Denies any chest pain.  Noted to be off of oxygen this morning.     Assessment/Plan:  Acute Hypoxic Resp. Failure/Pneumonia due to COVID-19  COVID-19 Labs  Recent Labs    08/30/19 0052 08/31/19 0043 09/01/19 0312 09/01/19 0315  DDIMER 7.48* 7.83*  --  2.81*  FERRITIN 381* 226  --   --   CRP 6.5* 4.6* 2.7*  --     Lab Results  Component Value Date   SARSCOV2NAA Detected (A) 08/22/2019    Patient seems to have stabilized from a COVID-19 standpoint.  He remains afebrile.  He is noted to be saturating normal on room air this morning.  He has completed course of remdesivir.  He remains on  steroids.  He did not require Actemra or convalescent plasma.  He was found to have acute PE as discussed below.  His inflammatory markers have been improving.  CRP is down to 2.7.  D-dimer is now better at 2.81.  Mobilize.  Check ambulatory pulse oximetry.  Continue with incentive spirometry.  Acute pulmonary embolism Patient found to have acute PE on CT scan done yesterday.  Patient started on IV heparin.  He seems to be stable.  He will be transitioned to Xarelto today.  Likely reason is COVID-19.  He will need anticoagulation for 6 months.  Acute renal failure This was due to hypovolemia from COVID-19.  Patient was gently hydrated.  His ACE inhibitor and HCTZ were held.  Renal function improved.  Was given contrast yesterday.  Creatinine is normal today.  Recheck tomorrow.  Monitor urine output.    Essential hypertension Patient on amlodipine which is being continued.  Lisinopril and HCTZ on hold.  Blood pressure is reasonably well controlled.  Continue to monitor.    Hypothyroidism Continue with levothyroxine.  DVT Prophylaxis: IV heparin to be changed over to rivaroxaban today Code Status: Full code Family Communication: Discussed with patient and his wife.  Son being updated on a daily basis. Disposition Plan: Ambulate.  Hopefully discharge tomorrow.     Medications:  Scheduled: . amLODipine  10 mg Oral Daily  . cholecalciferol  2,000 Units Oral Daily  .  dexamethasone (DECADRON) injection  6 mg Intravenous Q24H  . folic acid  1 mg Oral Daily  . Ipratropium-Albuterol  2 puff Inhalation QID  . levothyroxine  137 mcg Oral QAC breakfast  . multivitamin with minerals  1 tablet Oral Daily  . pantoprazole  40 mg Oral Daily  . Ensure Max Protein  11 oz Oral Daily  . thiamine  100 mg Oral Daily  . vitamin C  500 mg Oral Daily  . zinc sulfate  220 mg Oral Daily   Continuous: . heparin 950 Units/hr (09/01/19 0646)   DTO:[id number], guaiFENesin-dextromethorphan,  hydrALAZINE   Objective:  Vital Signs  Vitals:   08/31/19 0718 08/31/19 1621 08/31/19 2015 09/01/19 0736  BP: 134/74 (!) 149/75 138/78 139/79  Pulse: 78 79 76 62  Resp: 20 (!) 22 17 18   Temp: 97.8 F (36.6 C) 98.2 F (36.8 C) 98.2 F (36.8 C) 98.4 F (36.9 C)  TempSrc: Oral Oral Oral Oral  SpO2: 95% 96% 96% 95%  Weight:      Height:        Intake/Output Summary (Last 24 hours) at 09/01/2019 1045 Last data filed at 09/01/2019 0700 Gross per 24 hour  Intake 361.09 ml  Output 775 ml  Net -413.91 ml   Filed Weights   08/28/19 1733  Weight: 76.3 kg    General appearance: Awake alert.  In no distress.  Noted to be less fatigued today. Resp: Normal effort at rest.  Coarse breath sounds bilaterally with few crackles at the bases.  No wheezing or Cardio: S1-S2 is normal regular.  No S3-S4.  No rubs murmurs or bruit GI: Abdomen is soft.  Nontender nondistended.  Bowel sounds are present normal.  No masses organomegaly Extremities: No edema.  Full range of motion of lower extremities. Neurologic: Alert and oriented x3.  No focal neurological deficits.      Lab Results:  Data Reviewed: I have personally reviewed following labs and imaging studies  CBC: Recent Labs  Lab 08/28/19 0637 08/29/19 0020 08/30/19 0052 08/31/19 0043 09/01/19 0315  WBC 7.5 5.7 8.5 8.9 8.7  NEUTROABS 6.7 4.2 6.9 7.0 6.7  HGB 14.5 13.7 13.0 13.4 13.2  HCT 44.1 41.7 40.0 41.2 39.9  MCV 93.4 92.3 93.0 92.6 91.5  PLT 277 308 366 391 825    Basic Metabolic Panel: Recent Labs  Lab 08/28/19 0637 08/29/19 0020 08/30/19 0052 08/31/19 0043 09/01/19 0315  NA 137 138 137 138 138  K 4.1 4.3 4.3 4.2 4.5  CL 102 102 102 104 104  CO2 24 24 24 25 27   GLUCOSE 145* 127* 133* 137* 135*  BUN 37* 41* 45* 35* 31*  CREATININE 1.42* 1.31* 1.24 1.04 0.97  CALCIUM 8.6* 8.9 8.8* 9.1 8.9  MG 2.0 2.0 1.9 1.9 2.1  PHOS 3.0  --   --   --   --     GFR: Estimated Creatinine Clearance: 65.6 mL/min (by C-G  formula based on SCr of 0.97 mg/dL).  Liver Function Tests: Recent Labs  Lab 08/28/19 0637 08/29/19 0020 08/30/19 0052 08/31/19 0043 09/01/19 0315  AST 45* 44* 36 26 21  ALT 25 26 31 29 29   ALKPHOS 54 50 53 49 46  BILITOT 0.4 0.7 0.3 0.5 0.6  PROT 6.9 6.8 6.6 6.4* 6.3*  ALBUMIN 3.0* 3.2* 2.9* 2.8* 3.0*     Anemia Panel: Recent Labs    08/30/19 0052 08/31/19 0043  FERRITIN 381* 226    No results found for this or  any previous visit (from the past 240 hour(s)).    Radiology Studies: Ct Angio Chest Pe W Or Wo Contrast  Result Date: 08/31/2019 CLINICAL DATA:  COVID-19 pneumonia, respiratory failure and elevated D-dimer. EXAM: CT ANGIOGRAPHY CHEST WITH CONTRAST TECHNIQUE: Multidetector CT imaging of the chest was performed using the standard protocol during bolus administration of intravenous contrast. Multiplanar CT image reconstructions and MIPs were obtained to evaluate the vascular anatomy. CONTRAST:  80mL OMNIPAQUE IOHEXOL 350 MG/ML SOLN COMPARISON:  None. FINDINGS: Cardiovascular: Bilateral nonocclusive acute pulmonary embolism noted in the right middle lobe, right lower lobe, right upper lobe, left upper lobe, lingula and left lower lobe. No evidence of right heart strain by CTA. Central pulmonary arteries are normal in caliber and there is no evidence of saddle pulmonary embolism. The heart size is normal. Calcified coronary artery plaque in a 3 vessel distribution. No pericardial fluid identified. The thoracic aorta is normal in caliber. Mediastinum/Nodes: No enlarged mediastinal, hilar, or axillary lymph nodes. Thyroid gland, trachea, and esophagus demonstrate no significant findings. Large hiatal hernia present. Lungs/Pleura: Scattered pulmonary infiltrates throughout both lungs consistent with known COVID-19 pneumonia. No pulmonary edema, pneumothorax or pleural effusions identified. Upper Abdomen: Diffuse hepatic steatosis in the visualized liver. Musculoskeletal: No chest  wall abnormality. No acute or significant osseous findings. Review of the MIP images confirms the above findings. IMPRESSION: 1. Bilateral nonocclusive acute pulmonary embolism throughout both lungs. No evidence of right heart strain by CTA. 2. Calcified coronary artery plaque in a 3 vessel distribution. 3. Large hiatal hernia. 4. Diffuse hepatic steatosis. These results will be called to the ordering clinician or representative by the Radiologist Assistant, and communication documented in the PACS or zVision Dashboard. Electronically Signed   By: Irish Lack M.D.   On: 08/31/2019 09:01   Dg Chest Port 1 View  Result Date: 08/31/2019 CLINICAL DATA:  COVID-19 positive. EXAM: PORTABLE CHEST 1 VIEW COMPARISON:  Chest x-ray dated 08/27/2019. FINDINGS: Borderline cardiomegaly, stable. Lungs are now clear. No pleural effusion or pneumothorax is seen. Osseous structures about the chest are unremarkable. IMPRESSION: 1. No active cardiopulmonary disease. No evidence of pneumonia or pulmonary edema on today's exam. 2. Stable borderline cardiomegaly. Electronically Signed   By: Bary Richard M.D.   On: 08/31/2019 06:24       LOS: 5 days   Harun Brumley Rito Ehrlich  [facility] Pager on [email or web address]  09/01/2019, 10:45 AM

## 2019-09-01 NOTE — Care Management Important Message (Signed)
Important Message  Patient Details  Name: Nicholas [name] MRN: [id number] Date of Birth: 17-Mar-1946   Medicare Important Message Given:  Yes - Important Message mailed due to current National Emergency  Verbal consent obtained due to current National Emergency  Relationship to patient: Spouse/Significant Other Contact Name: Madilyn Fireman Call Date: 09/01/19  Time: 1415 Phone: [phone] Outcome: No Answer/Busy Important Message mailed to: Patient address on file    Delorse Lek 09/01/2019, 2:16 PM

## 2019-09-01 NOTE — Progress Notes (Signed)
ANTICOAGULATION CONSULT NOTE - Follow Up Consult  Pharmacy Consult for heparin --> xarelto Indication: acute pulmonary embolus  Allergies  Allergen Reactions  . Morphine And Related Nausea Only    Patient Measurements: Height: 5\' 8"  (172.7 cm) Weight: 168 lb 3.4 oz (76.3 kg) IBW/kg (Calculated) : 68.4 Heparin Dosing Weight:   Vital Signs: Temp: 98.4 F (36.9 C) (11/30 0736) Temp Source: Oral (11/30 0736) BP: 139/79 (11/30 0736) Pulse Rate: 78 (11/30 1400)  Labs: Recent Labs    08/30/19 0052 08/31/19 0043 08/31/19 1705 09/01/19 0312 09/01/19 0315 09/01/19 1258  HGB 13.0 13.4  --   --  13.2  --   HCT 40.0 41.2  --   --  39.9  --   PLT 366 391  --   --  391  --   HEPARINUNFRC  --   --  2.02* 1.08*  --  1.01*  CREATININE 1.24 1.04  --   --  0.97  --     Estimated Creatinine Clearance: 65.6 mL/min (by C-G formula based on SCr of 0.97 mg/dL).  Assessment: Patient's a 73 y.o M with COVID currently on heparin drip for acute bilateral PE.  Pharmacy was consulted to transition to [place] on 11/30.  - heparin level came back at 1PM was elevated at 1.01 - per pt's RN, no bleeding noted - cbc stable   Plan:  - d/c heparin drip - start xarelto 15 mg bid x21 days, then 20 mg daily - monitor for s/s bleeding  Serra Younan P 09/01/2019,2:41 PM

## 2019-09-02 LAB — COMPREHENSIVE METABOLIC PANEL
ALT: 48 U/L — ABNORMAL HIGH (ref 0–44)
AST: 29 U/L (ref 15–41)
Albumin: 2.9 g/dL — ABNORMAL LOW (ref 3.5–5.0)
Alkaline Phosphatase: 51 U/L (ref 38–126)
Anion gap: 9 (ref 5–15)
BUN: 31 mg/dL — ABNORMAL HIGH (ref 8–23)
CO2: 25 mmol/L (ref 22–32)
Calcium: 9.2 mg/dL (ref 8.9–10.3)
Chloride: 103 mmol/L (ref 98–111)
Creatinine, Ser: 0.92 mg/dL (ref 0.61–1.24)
GFR calc Af Amer: >60 mL/min (ref >60)
GFR calc non Af Amer: >60 mL/min (ref >60)
Glucose, Bld: 141 mg/dL — ABNORMAL HIGH (ref 70–99)
Potassium: 4.5 mmol/L (ref 3.5–5.1)
Sodium: 137 mmol/L (ref 135–145)
Total Bilirubin: 0.4 mg/dL (ref 0.3–1.2)
Total Protein: 6.2 g/dL — ABNORMAL LOW (ref 6.5–8.1)

## 2019-09-02 LAB — CBC WITH DIFFERENTIAL/PLATELET
Abs Immature Granulocytes: 0.21 10*3/uL — ABNORMAL HIGH (ref 0.00–0.07)
Basophils Absolute: 0 10*3/uL (ref 0.0–0.1)
Basophils Relative: 0 %
Eosinophils Absolute: 0 10*3/uL (ref 0.0–0.5)
Eosinophils Relative: 0 %
HCT: 40.1 % (ref 39.0–52.0)
Hemoglobin: 13.4 g/dL (ref 13.0–17.0)
Immature Granulocytes: 2 %
Lymphocytes Relative: 11 %
Lymphs Abs: 1 10*3/uL (ref 0.7–4.0)
MCH: 30.7 pg (ref 26.0–34.0)
MCHC: 33.4 g/dL (ref 30.0–36.0)
MCV: 91.8 fL (ref 80.0–100.0)
Monocytes Absolute: 0.8 10*3/uL (ref 0.1–1.0)
Monocytes Relative: 8 %
Neutro Abs: 7 10*3/uL (ref 1.7–7.7)
Neutrophils Relative %: 79 %
Platelets: 378 10*3/uL (ref 150–400)
RBC: 4.37 MIL/uL (ref 4.22–5.81)
RDW: 13.2 % (ref 11.5–15.5)
WBC: 9 10*3/uL (ref 4.0–10.5)
nRBC: 0 % (ref 0.0–0.2)

## 2019-09-02 LAB — C-REACTIVE PROTEIN: CRP: 2.2 mg/dL — ABNORMAL HIGH (ref <1.0)

## 2019-09-02 LAB — D-DIMER, QUANTITATIVE: D-Dimer, Quant: 2.64 ug/mL-FEU — ABNORMAL HIGH (ref 0.00–0.50)

## 2019-09-02 LAB — MAGNESIUM: Magnesium: 1.9 mg/dL (ref 1.7–2.4)

## 2019-09-02 MED ORDER — DEXAMETHASONE 2 MG PO TABS: ORAL_TABLET | ORAL | 0 refills | Status: DC

## 2019-09-02 MED ORDER — THIAMINE HCL 100 MG PO TABS: 100.0000 mg | ORAL_TABLET | Freq: Every day | ORAL | 0 refills | Status: AC

## 2019-09-02 MED ORDER — ZINC SULFATE 220 (50 ZN) MG PO CAPS: 220.0000 mg | ORAL_CAPSULE | Freq: Every day | ORAL | 0 refills | Status: AC

## 2019-09-02 MED ORDER — ALBUTEROL SULFATE HFA 108 (90 BASE) MCG/ACT IN AERS: 2.0000 | INHALATION_SPRAY | Freq: Four times a day (QID) | RESPIRATORY_TRACT | 0 refills | Status: AC | PRN

## 2019-09-02 MED ORDER — ADULT MULTIVITAMIN W/MINERALS CH: 1.0000 | ORAL_TABLET | Freq: Every day | ORAL | Status: AC

## 2019-09-02 MED ORDER — ASCORBIC ACID 500 MG PO TABS: 500.0000 mg | ORAL_TABLET | Freq: Every day | ORAL | 0 refills | Status: AC

## 2019-09-02 MED ORDER — RIVAROXABAN (XARELTO) VTE STARTER PACK (15 & 20 MG): ORAL_TABLET | ORAL | 0 refills | Status: DC

## 2019-09-02 MED ORDER — RIVAROXABAN 20 MG PO TABS: 20.0000 mg | ORAL_TABLET | Freq: Every day | ORAL | 2 refills | Status: AC

## 2019-09-02 NOTE — Progress Notes (Signed)
Pt ambulated into the bathroom with Rm Air and minimal assistance. After about 10 minutes pt sats decreased into the mid 80s however pt denied feeling short of breath. HR and RR also with noted elevation. Hr increased in the low 100sbpm and RR into the 30s. O2 applied via [place] and was able to tolerate a short wall in the halls. Once returned to the bed o2 removed and pt sats remained >90%. HR and RR were back WNL quickly. Will continue to monitor.

## 2019-09-02 NOTE — TOC Progression Note (Signed)
Transition of Care [facility]) - Progression Note    Patient Details  Name: Nicholas [name] MRN: [id number] Date of Birth: 09/10/1946  Transition of Care [facility]) CM/SW Contact  Joaquin Courts, RN Phone Number: 09/02/2019, 12:25 PM  Clinical Narrative:    Patient set up with [employer] [facility] for home oxygen. Agency will deliver concentrator to home. AC to bring portable tank to bedside for discharge to home.  CM called patient's pharmacy to provide 30-day trial card number for Xarelto. Pharmacy states patient is not a candidate for the 30-day trial offer due to having medicare part D.     Expected Discharge Plan: Home/Self Care Barriers to Discharge: No Barriers Identified  Expected Discharge Plan and Services Expected Discharge Plan: Home/Self Care   Discharge Planning Services: CM Consult   Living arrangements for the past 2 months: Single Family Home Expected Discharge Date: 09/02/19               DME Arranged: Oxygen DME Agency: [facility] Date DME Agency Contacted: 09/02/19 Time DME Agency Contacted: [id number] Representative spoke with at DME Agency: Learta Codding             Social Determinants of Health ([place]) Interventions    Readmission Risk Interventions No flowsheet data found.

## 2019-09-02 NOTE — Plan of Care (Signed)
Pt  sat up at the bedside he went into the 80's we didnt walk long either he also felt weak his O2 went as far as the low 70s. No tachycardia, his respirations went up tp 30's hes back in bed I also put him on 1L [place] and at rest now hes back in the 90s.

## 2019-09-02 NOTE — Discharge Summary (Signed)
[facility]  Physician Discharge Summary   Patient ID: Nicholas RanaJoseph A [name] MRN: [id number] DOB/AGE: 73/06/1946 73 y.o.  Admit date: 08/27/2019 Discharge date: 09/02/2019  [facility]  DISCHARGE DIAGNOSES:  Pneumonia due to COVID-19 Acute respiratory failure with hypoxia Acute pulmonary embolism Acute renal failure, resolved Essential hypertension Hypothyroidism  RECOMMENDATIONS FOR OUTPATIENT FOLLOW UP: 1. Home oxygen has been ordered 2. Lisinopril/HCTZ has been held for now.    Home Health: None Equipment/Devices: Home oxygen  CODE STATUS: Full code  DISCHARGE CONDITION: fair  Diet recommendation: As before  INITIAL HISTORY: 73 year old gentleman with history of peptic ulcer, hypothyroidism hypertension. Patient and his wife were diagnosed with Covid virus infection about 10 days ago. They were managing at home.  Subsequently started having worsening symptoms.  Started feeling short of breath.  His wife is also experiencing similar symptoms.  They both presented to the emergency department.  He was found to be hypoxic.  Chest x-ray showed multifocal pneumonia.  He was hospitalized for further management.    HOSPITAL COURSE:   Acute Hypoxic Resp. Failure/Pneumonia due to COVID-19 Patient was hospitalized.  He was placed on remdesivir and steroids.  His inflammatory markers were trended.  They started improving.  D-dimer did bump up to 7.48 and then 7.83.  CT angiogram of the lungs were done.  See below.  Patient has ambulated.  He tends to desaturate with ambulation.  He will be discharged on home oxygen.  Acute pulmonary embolism Patient found to have acute PE on CT scan.  Patient started on IV heparin.  He was subsequently transition to rivaroxaban. Likely reason is COVID-19.  He will need anticoagulation for 6 months.  Acute renal failure This was due to hypovolemia from COVID-19.  Patient was gently hydrated.  His ACE inhibitor and HCTZ were held.    Renal  function is now normal.     Essential hypertension Okay to continue with amlodipine.  Continue to hold lisinopril and HCTZ.   Hypothyroidism Continue with levothyroxine.  Overall stable.  Patient feels better.  Wants to go home.  Okay for discharge today.  Discussed with his son.     PERTINENT LABS:  The results of significant diagnostics from this hospitalization (including imaging, microbiology, ancillary and laboratory) are listed below for reference.     Labs:  COVID-19 Labs  Recent Labs    08/31/19 0043 09/01/19 0312 09/01/19 0315 09/02/19 0336  DDIMER 7.83*  --  2.81* 2.64*  FERRITIN 226  --   --   --   CRP 4.6* 2.7*  --  2.2*    Lab Results  Component Value Date   SARSCOV2NAA Detected (A) 08/22/2019      Basic Metabolic Panel: Recent Labs  Lab 08/28/19 [id number] 08/29/19 0020 08/30/19 0052 08/31/19 0043 09/01/19 0315 09/02/19 0336  NA 137 138 137 138 138 137  K 4.1 4.3 4.3 4.2 4.5 4.5  CL 102 102 102 104 104 103  CO2 24 24 24 25 27 25   GLUCOSE 145* 127* 133* 137* 135* 141*  BUN 37* 41* 45* 35* 31* 31*  CREATININE 1.42* 1.31* 1.24 1.04 0.97 0.92  CALCIUM 8.6* 8.9 8.8* 9.1 8.9 9.2  MG 2.0 2.0 1.9 1.9 2.1 1.9  PHOS 3.0  --   --   --   --   --    Liver Function Tests: Recent Labs  Lab 08/29/19 0020 08/30/19 0052 08/31/19 0043 09/01/19 0315 09/02/19 0336  AST 44* 36 26 21 29   ALT 26  48*  ALKPHOS 50 53 49 46 51  BILITOT 0.7 0.3 0.5 0.6 0.4  PROT 6.8 6.6 6.4* 6.3* 6.2*  ALBUMIN 3.2* 2.9* 2.8* 3.0* 2.9*   CBC: Recent Labs  Lab 08/29/19 0020 08/30/19 0052 08/31/19 0043 09/01/19 0315 09/02/19 0336  WBC 5.7 8.5 8.9 8.7 9.0  NEUTROABS 4.2 6.9 7.0 6.7 7.0  HGB 13.7 13.0 13.4 13.2 13.4  HCT 41.7 40.0 41.2 39.9 40.1  MCV 92.3 93.0 92.6 91.5 91.8  PLT 308 366 391 391 378     IMAGING STUDIES Ct Angio Chest Pe W Or Wo Contrast  Result Date: 08/31/2019 CLINICAL DATA:  COVID-19 pneumonia, respiratory failure and elevated  D-dimer. EXAM: CT ANGIOGRAPHY CHEST WITH CONTRAST TECHNIQUE: Multidetector CT imaging of the chest was performed using the standard protocol during bolus administration of intravenous contrast. Multiplanar CT image reconstructions and MIPs were obtained to evaluate the vascular anatomy. CONTRAST:  80mL OMNIPAQUE IOHEXOL 350 MG/ML SOLN COMPARISON:  None. FINDINGS: Cardiovascular: Bilateral nonocclusive acute pulmonary embolism noted in the right middle lobe, right lower lobe, right upper lobe, left upper lobe, lingula and left lower lobe. No evidence of right heart strain by CTA. Central pulmonary arteries are normal in caliber and there is no evidence of saddle pulmonary embolism. The heart size is normal. Calcified coronary artery plaque in a 3 vessel distribution. No pericardial fluid identified. The thoracic aorta is normal in caliber. Mediastinum/Nodes: No enlarged mediastinal, hilar, or axillary lymph nodes. Thyroid gland, trachea, and esophagus demonstrate no significant findings. Large hiatal hernia present. Lungs/Pleura: Scattered pulmonary infiltrates throughout both lungs consistent with known COVID-19 pneumonia. No pulmonary edema, pneumothorax or pleural effusions identified. Upper Abdomen: Diffuse hepatic steatosis in the visualized liver. Musculoskeletal: No chest wall abnormality. No acute or significant osseous findings. Review of the MIP images confirms the above findings. IMPRESSION: 1. Bilateral nonocclusive acute pulmonary embolism throughout both lungs. No evidence of right heart strain by CTA. 2. Calcified coronary artery plaque in a 3 vessel distribution. 3. Large hiatal hernia. 4. Diffuse hepatic steatosis. These results will be called to the ordering clinician or representative by the Radiologist Assistant, and communication documented in the PACS or zVision Dashboard. Electronically Signed   By: Irish Lack M.D.   On: 08/31/2019 09:01   US Renal  Result Date: 08/28/2019 CLINICAL  DATA:  Acute renal injury EXAM: RENAL / URINARY TRACT ULTRASOUND COMPLETE COMPARISON:  None. FINDINGS: Right Kidney: Renal measurements: 12.1 x 5.7 x 6.4 cm. = volume: 234 mL. No hydronephrosis is noted. A 1.8 cm cyst is noted in the midportion of the right kidney. Left Kidney: Renal measurements: 10.6 x 5.5 x 6.4 cm. = volume: 195 mL. Echogenicity within normal limits. No mass or hydronephrosis visualized. Bladder: Decompressed Other: Prostate is mildly prominent indenting upon the inferior aspect of bladder. IMPRESSION: Right renal cyst. No other focal abnormality is noted. Electronically Signed   By: Alcide Clever M.D.   On: 08/28/2019 03:21   Dg Chest Port 1 View  Result Date: 08/31/2019 CLINICAL DATA:  COVID-19 positive. EXAM: PORTABLE CHEST 1 VIEW COMPARISON:  Chest x-ray dated 08/27/2019. FINDINGS: Borderline cardiomegaly, stable. Lungs are now clear. No pleural effusion or pneumothorax is seen. Osseous structures about the chest are unremarkable. IMPRESSION: 1. No active cardiopulmonary disease. No evidence of pneumonia or pulmonary edema on today's exam. 2. Stable borderline cardiomegaly. Electronically Signed   By: Bary Richard M.D.   On: 08/31/2019 06:24   Dg Chest Portable 1 View  Result  Date: 08/27/2019 CLINICAL DATA:  Shortness of breath, COVID positive EXAM: PORTABLE CHEST 1 VIEW COMPARISON:  08/16/2009 FINDINGS: Left upper lobe and bilateral lower lobe opacities, compatible with pneumonia in this patient with known COVID. Possible small left pleural effusion. No pneumothorax. The heart is top-normal in size.  Thoracic aortic atherosclerosis. Suspected moderate hiatal hernia. IMPRESSION: Multifocal pneumonia in this patient with known COVID. Possible small left pleural effusion. Thoracic aortic atherosclerosis. Electronically Signed   By: Julian Hy M.D.   On: 08/27/2019 19:19    DISCHARGE EXAMINATION: Vitals:   09/01/19 1914 09/02/19 0357 09/02/19 0814 09/02/19 1204  BP: 128/73  131/75  121/74  Pulse: 81 (!) 58  68  Resp: (!) 29 19  (!) 23  Temp: 97.9 F (36.6 C)  97.9 F (36.6 C) 98 F (36.7 C)  TempSrc: Oral  Oral Oral  SpO2: 96% 92%    Weight:      Height:       General appearance: Awake alert.  In no distress Resp: Normal effort at rest.  Coarse breath sound.  No wheezing or rhonchi.  Few crackles at the bases. Cardio: S1-S2 is normal regular.  No S3-S4.  No rubs murmurs or bruit GI: Abdomen is soft.  Nontender nondistended.  Bowel sounds are present normal.  No masses organomegaly Extremities: No edema.  Full range of motion of lower extremities. Neurologic: Alert and oriented x3.  No focal neurological deficits.    DISPOSITION: Home  Discharge Instructions    Ambulatory referral to Pulmonology   Complete by: As directed    covid patient, home o2. post dc follow up. 2-3 weeks   Call MD for:  difficulty breathing, headache or visual disturbances   Complete by: As directed    Call MD for:  extreme fatigue   Complete by: As directed    Call MD for:  persistant dizziness or light-headedness   Complete by: As directed    Call MD for:  persistant nausea and vomiting   Complete by: As directed    Call MD for:  severe uncontrolled pain   Complete by: As directed    Diet - low sodium heart healthy   Complete by: As directed    Discharge instructions   Complete by: As directed    Please be sure to follow-up with your primary care provider in 1 to 2 weeks.  Take all your medications as prescribed.  Watch closely for signs of bleeding as we discussed earlier today.  COVID 19 INSTRUCTIONS  - You are felt to be stable enough to no longer require inpatient monitoring, testing, and treatment, though you will need to follow the recommendations below: - Based on the CDC's non-test criteria for ending self-isolation: You may not return to work/leave the home until at least 21 days since symptom onset AND 3 days without a fever (without taking tylenol,  ibuprofen, etc.) AND have improvement in respiratory symptoms. - Do not take NSAID medications (including, but not limited to, ibuprofen, advil, motrin, naproxen, aleve, goody's powder, etc.) - Follow up with your doctor in the next week via telehealth or seek medical attention right away if your symptoms get WORSE.  - Consider donating plasma after you have recovered (either 14 days after a negative test or 28 days after symptoms have completely resolved) because your antibodies to this virus may be helpful to give to others with life-threatening infections. Please go to the website [email or web address] if you would like to consider volunteering for plasma donation.  Directions for you at home:  Wear a facemask You should wear a facemask that covers your nose and mouth when you are in the same room with other people and when you visit a healthcare provider. People who live with or visit you should also wear a facemask while they are in the same room with you.  Separate yourself from other people in your home As much as possible, you should stay in a different room from other people in your home. Also, you should use a separate bathroom, if available.  Avoid sharing household items You should not share dishes, drinking glasses, cups, eating utensils, towels, bedding, or other items with other people in your home. After using these items, you should wash them thoroughly with soap and water.  Cover your coughs and sneezes Cover your mouth and nose with a tissue when you cough or sneeze, or you can cough or sneeze into your sleeve. Throw used tissues in a lined trash can, and immediately wash your hands with soap and water for at least 20 seconds or use an alcohol-based hand rub.  Wash your [employer] your hands often and thoroughly with soap and water for at least 20 seconds. You can use an alcohol-based hand sanitizer if soap and water are not available and if your hands are not visibly  dirty. Avoid touching your eyes, nose, and mouth with unwashed hands.  Directions for those who live with, or provide care at home for you:  Limit the number of people who have contact with the patient If possible, have only one caregiver for the patient. Other household members should stay in another home or place of residence. If this is not possible, they should stay in another room, or be separated from the patient as much as possible. Use a separate bathroom, if available. Restrict visitors who do not have an essential need to be in the home.  Ensure good ventilation Make sure that shared spaces in the home have good air flow, such as from an air conditioner or an opened window, weather permitting.  Wash your hands often Wash your hands often and thoroughly with soap and water for at least 20 seconds. You can use an alcohol based hand sanitizer if soap and water are not available and if your hands are not visibly dirty. Avoid touching your eyes, nose, and mouth with unwashed hands. Use disposable paper towels to dry your hands. If not available, use dedicated cloth towels and replace them when they become wet.  Wear a facemask and gloves Wear a disposable facemask at all times in the room and gloves when you touch or have contact with the patient's blood, body fluids, and/or secretions or excretions, such as sweat, saliva, sputum, nasal mucus, vomit, urine, or feces.  Ensure the mask fits over your nose and mouth tightly, and do not touch it during use. Throw out disposable facemasks and gloves after using them. Do not reuse. Wash your hands immediately after removing your facemask and gloves. If your personal clothing becomes contaminated, carefully remove clothing and launder. Wash your hands after handling contaminated clothing. Place all used disposable facemasks, gloves, and other waste in a lined container before disposing them with other household waste. Remove gloves and wash  your hands immediately after handling these items.  Do not share dishes, glasses, or other household items with the patient Avoid sharing household items. You should not share dishes, drinking glasses, cups, eating utensils, towels, bedding, or other items with a  patient who is confirmed to have, or being evaluated for, COVID-19 infection. After the person uses these items, you should wash them thoroughly with soap and water.  Wash laundry thoroughly Immediately remove and wash clothes or bedding that have blood, body fluids, and/or secretions or excretions, such as sweat, saliva, sputum, nasal mucus, vomit, urine, or feces, on them. Wear gloves when handling laundry from the patient. Read and follow directions on labels of laundry or clothing items and detergent. In general, wash and dry with the warmest temperatures recommended on the label.  Clean all areas the individual has used often Clean all touchable surfaces, such as counters, tabletops, doorknobs, bathroom fixtures, toilets, phones, keyboards, tablets, and bedside tables, every day. Also, clean any surfaces that may have blood, body fluids, and/or secretions or excretions on them. Wear gloves when cleaning surfaces the patient has come in contact with. Use a diluted bleach solution (e.g., dilute bleach with 1 part bleach and 10 parts water) or a household disinfectant with a label that says EPA-registered for coronaviruses. To make a bleach solution at home, add 1 tablespoon of bleach to 1 quart (4 cups) of water. For a larger supply, add  cup of bleach to 1 gallon (16 cups) of water. Read labels of cleaning products and follow recommendations provided on product labels. Labels contain instructions for safe and effective use of the cleaning product including precautions you should take when applying the product, such as wearing gloves or eye protection and making sure you have good ventilation during use of the product. Remove gloves and  wash hands immediately after cleaning.  Monitor yourself for signs and symptoms of illness Caregivers and household members are considered close contacts, should monitor their health, and will be asked to limit movement outside of the home to the extent possible. Follow the monitoring steps for close contacts listed on the symptom monitoring form.   If you have additional questions, contact your local health department or call the epidemiologist on call at [phone] (available 24/7). This guidance is subject to change. For the most up-to-date guidance from [facility], please refer to their website: [email or web address]   You were cared for by a hospitalist during your hospital stay. If you have any questions about your discharge medications or the care you received while you were in the hospital after you are discharged, you can call the unit and asked to speak with the hospitalist on call if the hospitalist that took care of you is not available. Once you are discharged, your primary care physician will handle any further medical issues. Please note that NO REFILLS for any discharge medications will be authorized once you are discharged, as it is imperative that you return to your primary care physician (or establish a relationship with a primary care physician if you do not have one) for your aftercare needs so that they can reassess your need for medications and monitor your lab values. If you do not have a primary care physician, you can call [phone] for a physician referral.   Increase activity slowly   Complete by: As directed    MyChart COVID-19 home monitoring program   Complete by: Sep 02, 2019    Is the patient willing to use the MyChart Mobile App for home monitoring?: Yes   Temperature monitoring   Complete by: Sep 02, 2019    After how many days would you like to receive a notification of this patient's flowsheet entries?: 1  Allergies as of 09/02/2019      Reactions   Morphine And Related Nausea Only      Medication List    STOP taking these medications   ipratropium 0.06 % nasal spray Commonly known as: ATROVENT   lisinopril-hydrochlorothiazide 20-12.5 MG tablet Commonly known as: ZESTORETIC     TAKE these medications   albuterol 108 (90 Base) MCG/ACT inhaler Commonly known as: VENTOLIN HFA Inhale 2 puffs into the lungs every 6 (six) hours as needed for wheezing or shortness of breath.   amLODipine 5 MG tablet Commonly known as: NORVASC Take 5 mg by mouth daily.   ascorbic acid 500 MG tablet Commonly known as: VITAMIN C Take 1 tablet (500 mg total) by mouth daily for 10 days. Start taking on: September 03, 2019   CALCIUM + D PO Take by mouth daily.   chlorpheniramine-HYDROcodone 10-8 MG/5ML Suer Commonly known as: TUSSIONEX Take 5 mLs by mouth every 12 (twelve) hours as needed for cough.   cholecalciferol 25 MCG (1000 UT) tablet Commonly known as: VITAMIN D3 Take 2,000 Units by mouth daily.   dexamethasone 2 MG tablet Commonly known as: DECADRON Take 2 tablets once daily for 3 days, then 1 tablet once daily for 3 days, then STOP.   fish oil-omega-3 fatty acids 1000 MG capsule Take 1 g by mouth daily.   levothyroxine 137 MCG tablet Commonly known as: SYNTHROID Take 137 mcg by mouth daily before breakfast.   multivitamin with minerals Tabs tablet Take 1 tablet by mouth daily. Start taking on: September 03, 2019   pantoprazole 40 MG tablet Commonly known as: PROTONIX Take 40 mg by mouth daily.   Rivaroxaban 15 & 20 MG Tbpk Follow package directions: Take one  tablet by mouth twice a day. On day 22, switch to one  tablet once a day. Take with food.   rivaroxaban 20 MG Tabs tablet Commonly known as: XARELTO Take 1 tablet (20 mg total) by mouth daily with supper. Start only after you have completed the starter pack. Start taking on: September 23, 2019   thiamine 100  MG tablet Take 1 tablet (100 mg total) by mouth daily. Start taking on: September 03, 2019   zinc sulfate 220 (50 Zn) MG capsule Take 1 capsule (220 mg total) by mouth daily for 10 days. Start taking on: September 03, 2019            Durable Medical Equipment  (From admission, onward)         Start     Ordered   09/02/19 8119  For home use only DME oxygen  Once    Question Answer Comment  Length of Need 6 Months   Mode or (Route) Nasal cannula   Liters per Minute 2   Frequency Continuous (stationary and portable oxygen unit needed)   Oxygen conserving device Yes   Oxygen delivery system Gas      09/02/19 0732             TOTAL DISCHARGE TIME: 35 minutes  Kaedyn Polivka [employer] on [email or web address]  09/02/2019, 2:51 PM

## 2019-09-02 NOTE — Plan of Care (Signed)
SATURATION QUALIFICATIONS: (This note is used to comply with regulatory documentation for home oxygen)  Patient Saturations on Room Air at Rest = 92%  Patient Saturations on Room Air while Ambulating = 75  Patient Saturations on Liters of oxygen while Ambulating = 10  Please briefly explain why patient needs home oxygen: Upon exertion pt needs oxyygen there is a increased work of breathing respirations go up to 30's and increased demand for oxygen while walking. Despite walking on 10L he still maintaned in the high 80's>

## 2019-09-02 NOTE — Discharge Instructions (Signed)
COVID-19 COVID-19 is a respiratory infection that is caused by a virus called severe acute respiratory syndrome coronavirus 2 (SARS-CoV-2). The disease is also known as coronavirus disease or novel coronavirus. In some people, the virus may not cause any symptoms. In others, it may cause a serious infection. The infection can get worse quickly and can lead to complications, such as:  Pneumonia, or infection of the lungs.  Acute respiratory distress syndrome or ARDS. This is fluid build-up in the lungs.  Acute respiratory failure. This is a condition in which there is not enough oxygen passing from the lungs to the body.  Sepsis or septic shock. This is a serious bodily reaction to an infection.  Blood clotting problems.  Secondary infections due to bacteria or fungus. The virus that causes COVID-19 is contagious. This means that it can spread from person to person through droplets from coughs and sneezes (respiratory secretions). What are the causes? This illness is caused by a virus. You may catch the virus by:  Breathing in droplets from an infected person's cough or sneeze.  Touching something, like a table or a doorknob, that was exposed to the virus (contaminated) and then touching your mouth, nose, or eyes. What increases the risk? Risk for infection You are more likely to be infected with this virus if you:  Live in or travel to an area with a COVID-19 outbreak.  Come in contact with a sick person who recently traveled to an area with a COVID-19 outbreak.  Provide care for or live with a person who is infected with COVID-19. Risk for serious illness You are more likely to become seriously ill from the virus if you:  Are 66 years of age or older.  Have a long-term disease that lowers your body's ability to fight infection (immunocompromised).  Live in a nursing home or long-term care facility.  Have a long-term (chronic) disease such as: ? Chronic lung disease,  including chronic obstructive pulmonary disease or asthma ? Heart disease. ? Diabetes. ? Chronic kidney disease. ? Liver disease.  Are obese. What are the signs or symptoms? Symptoms of this condition can range from mild to severe. Symptoms may appear any time from 2 to 14 days after being exposed to the virus. They include:  A fever.  A cough.  Difficulty breathing.  Chills.  Muscle pains.  A sore throat.  Loss of taste or smell. Some people may also have stomach problems, such as nausea, vomiting, or diarrhea. Other people may not have any symptoms of COVID-19. How is this diagnosed? This condition may be diagnosed based on:  Your signs and symptoms, especially if: ? You live in an area with a COVID-19 outbreak. ? You recently traveled to or from an area where the virus is common. ? You provide care for or live with a person who was diagnosed with COVID-19.  A physical exam.  Lab tests, which may include: ? A nasal swab to take a sample of fluid from your nose. ? A throat swab to take a sample of fluid from your throat. ? A sample of mucus from your lungs (sputum). ? Blood tests.  Imaging tests, which may include, X-rays, CT scan, or ultrasound. How is this treated? At present, there is no medicine to treat COVID-19. Medicines that treat other diseases are being used on a trial basis to see if they are effective against COVID-19. Your health care provider will talk with you about ways to treat your symptoms. For  most people, the infection is mild and can be managed at home with rest, fluids, and over-the-counter medicines. Treatment for a serious infection usually takes places in a hospital intensive care unit (ICU). It may include one or more of the following treatments. These treatments are given until your symptoms improve.  Receiving fluids and medicines through an IV.  Supplemental oxygen. Extra oxygen is given through a tube in the nose, a face mask, or a  hood.  Positioning you to lie on your stomach (prone position). This makes it easier for oxygen to get into the lungs.  Continuous positive airway pressure (CPAP) or bi-level positive airway pressure (BPAP) machine. This treatment uses mild air pressure to keep the airways open. A tube that is connected to a motor delivers oxygen to the body.  Ventilator. This treatment moves air into and out of the lungs by using a tube that is placed in your windpipe.  Tracheostomy. This is a procedure to create a hole in the neck so that a breathing tube can be inserted.  Extracorporeal membrane oxygenation (ECMO). This procedure gives the lungs a chance to recover by taking over the functions of the heart and lungs. It supplies oxygen to the body and removes carbon dioxide. Follow these instructions at home: Lifestyle  If you are sick, stay home except to get medical care. Your health care provider will tell you how long to stay home. Call your health care provider before you go for medical care.  Rest at home as told by your health care provider.  Do not use any products that contain nicotine or tobacco, such as cigarettes, e-cigarettes, and chewing tobacco. If you need help quitting, ask your health care provider.  Return to your normal activities as told by your health care provider. Ask your health care provider what activities are safe for you. General instructions  Take over-the-counter and prescription medicines only as told by your health care provider.  Drink enough fluid to keep your urine pale yellow.  Keep all follow-up visits as told by your health care provider. This is important. How is this prevented?  There is no vaccine to help prevent COVID-19 infection. However, there are steps you can take to protect yourself and others from this virus. To protect yourself:   Do not travel to areas where COVID-19 is a risk. The areas where COVID-19 is reported change often. To identify  high-risk areas and travel restrictions, check the CDC travel website: FatFares.com.br  If you live in, or must travel to, an area where COVID-19 is a risk, take precautions to avoid infection. ? Stay away from people who are sick. ? Wash your hands often with soap and water for 20 seconds. If soap and water are not available, use an alcohol-based hand sanitizer. ? Avoid touching your mouth, face, eyes, or nose. ? Avoid going out in public, follow guidance from your state and local health authorities. ? If you must go out in public, wear a cloth face covering or face mask. ? Disinfect objects and surfaces that are frequently touched every day. This may include:  Counters and tables.  Doorknobs and light switches.  Sinks and faucets.  Electronics, such as phones, remote controls, keyboards, computers, and tablets. To protect others: If you have symptoms of COVID-19, take steps to prevent the virus from spreading to others.  If you think you have a COVID-19 infection, contact your health care provider right away. Tell your health care team that you think you  may have a COVID-19 infection.  Stay home. Leave your house only to seek medical care. Do not use public transport.  Do not travel while you are sick.  Wash your hands often with soap and water for 20 seconds. If soap and water are not available, use alcohol-based hand sanitizer.  Stay away from other members of your household. Let healthy household members care for children and pets, if possible. If you have to care for children or pets, wash your hands often and wear a mask. If possible, stay in your own room, separate from others. Use a different bathroom.  Make sure that all people in your household wash their hands well and often.  Cough or sneeze into a tissue or your sleeve or elbow. Do not cough or sneeze into your hand or into the air.  Wear a cloth face covering or face mask. Where to find more  information  Centers for Disease Control and Prevention: StickerEmporium.tn  World Health Organization: https://thompson-craig.com/ Contact a health care provider if:  You live in or have traveled to an area where COVID-19 is a risk and you have symptoms of the infection.  You have had contact with someone who has COVID-19 and you have symptoms of the infection. Get help right away if:  You have trouble breathing.  You have pain or pressure in your chest.  You have confusion.  You have bluish lips and fingernails.  You have difficulty waking from sleep.  You have symptoms that get worse. These symptoms may represent a serious problem that is an emergency. Do not wait to see if the symptoms will go away. Get medical help right away. Call your local emergency services (911 in the [place] Do not drive yourself to the hospital. Let the emergency medical personnel know if you think you have COVID-19. Summary  COVID-19 is a respiratory infection that is caused by a virus. It is also known as coronavirus disease or novel coronavirus. It can cause serious infections, such as pneumonia, acute respiratory distress syndrome, acute respiratory failure, or sepsis.  The virus that causes COVID-19 is contagious. This means that it can spread from person to person through droplets from coughs and sneezes.  You are more likely to develop a serious illness if you are 12 years of age or older, have a weak immunity, live in a nursing home, or have chronic disease.  There is no medicine to treat COVID-19. Your health care provider will talk with you about ways to treat your symptoms.  Take steps to protect yourself and others from infection. Wash your hands often and disinfect objects and surfaces that are frequently touched every day. Stay away from people who are sick and wear a mask if you are sick. This information is not intended to replace advice given to you by  your health care provider. Make sure you discuss any questions you have with your health care provider. Document Released: 10/24/2018 Document Revised: 02/13/2019 Document Reviewed: 10/24/2018 Elsevier Patient Education  2020 [employer].   Information on my medicine - XARELTO (rivaroxaban)  This medication education was reviewed with me or my healthcare representative as part of my discharge preparation.  The pharmacist that spoke with me during my hospital stay was:  Luisa Hart, PharmD, BCPS  WHY WAS Carlena Hurl PRESCRIBED FOR YOU? Xarelto was prescribed to treat blood clots that may have been found in the veins of your legs (deep vein thrombosis) or in your lungs (pulmonary embolism) and to reduce the  risk of them occurring again.  What do you need to know about Xarelto? The starting dose is one 15 mg tablet taken TWICE daily with food for the FIRST 21 DAYS then on 09/22/2019  the dose is changed to one 20 mg tablet taken ONCE A DAY with your evening meal.  DO NOT stop taking Xarelto without talking to the health care provider who prescribed the medication.  Refill your prescription for 20 mg tablets before you run out.  After discharge, you should have regular check-up appointments with your healthcare provider that is prescribing your Xarelto.  In the future your dose may need to be changed if your kidney function changes by a significant amount.  What do you do if you miss a dose? If you are taking Xarelto TWICE DAILY and you miss a dose, take it as soon as you remember. You may take two 15 mg tablets (total 30 mg) at the same time then resume your regularly scheduled 15 mg twice daily the next day.  If you are taking Xarelto ONCE DAILY and you miss a dose, take it as soon as you remember on the same day then continue your regularly scheduled once daily regimen the next day. Do not take two doses of Xarelto at the same time.   Important Safety Information Xarelto is a blood  thinner medicine that can cause bleeding. You should call your healthcare provider right away if you experience any of the following: ? Bleeding from an injury or your nose that does not stop. ? Unusual colored urine (red or dark brown) or unusual colored stools (red or black). ? Unusual bruising for unknown reasons. ? A serious fall or if you hit your head (even if there is no bleeding).  Some medicines may interact with Xarelto and might increase your risk of bleeding while on Xarelto. To help avoid this, consult your healthcare provider or pharmacist prior to using any new prescription or non-prescription medications, including herbals, vitamins, non-steroidal anti-inflammatory drugs (NSAIDs) and supplements.  This website has more information on Xarelto: https://guerra-benson.com/.

## 2019-09-04 ENCOUNTER — Other Ambulatory Visit: Payer: Self-pay

## 2019-09-04 NOTE — Patient Outreach (Addendum)
[place] [facility]) Care Management  09/04/2019  EMRY BARBATO 19-Sep-1946 [id number]     Transition of Care Referral  Referral Date: 09/04/2019 Referral Source: [employer] Discharge Report Date of Admission: 08/27/2019 Diagnosis: "COVID-19 infection" Date of Discharge: 09/02/2019 Facility: [place]: [facility]   Outreach attempt # 1 to patient. No answer at both numbers listed for patient. Upon chart review, noted that per hospital records patient's PCP is with [facility] in [place]. Patient must have [facility] PCP to be eligible for services.     Plan: RN CM will close case at this time.    Enzo Montgomery, RN,BSN,CCM [facility] Telephonic Care Management Coordinator Direct Phone: [phone] Toll Free: [phone] Fax: [phone]

## 2019-09-15 ENCOUNTER — Encounter (INDEPENDENT_AMBULATORY_CARE_PROVIDER_SITE_OTHER): Payer: Self-pay

## 2019-10-01 ENCOUNTER — Ambulatory Visit: Payer: Medicare HMO | Attending: Internal Medicine

## 2019-10-01 DIAGNOSIS — Z20828 Contact with and (suspected) exposure to other viral communicable diseases: Secondary | ICD-10-CM | POA: Diagnosis not present

## 2019-10-01 DIAGNOSIS — Z20822 Contact with and (suspected) exposure to covid-19: Secondary | ICD-10-CM

## 2019-10-02 LAB — NOVEL CORONAVIRUS, NAA: SARS-CoV-2, NAA: NOT DETECTED

## 2019-10-03 DIAGNOSIS — U071 COVID-19: Secondary | ICD-10-CM | POA: Diagnosis not present

## 2019-10-09 ENCOUNTER — Encounter: Payer: Self-pay | Admitting: Pulmonary Disease

## 2019-10-09 ENCOUNTER — Other Ambulatory Visit: Payer: Self-pay

## 2019-10-09 ENCOUNTER — Ambulatory Visit: Payer: Medicare HMO | Admitting: Pulmonary Disease

## 2019-10-09 VITALS — BP 116/70 | HR 83 | Temp 97.5°F | Ht 68.0 in | Wt 175.0 lb

## 2019-10-09 DIAGNOSIS — J1282 Pneumonia due to coronavirus disease 2019: Secondary | ICD-10-CM | POA: Diagnosis not present

## 2019-10-09 DIAGNOSIS — R0602 Shortness of breath: Secondary | ICD-10-CM

## 2019-10-09 DIAGNOSIS — U071 COVID-19: Secondary | ICD-10-CM

## 2019-10-09 NOTE — Progress Notes (Addendum)
Nicholas [name]    [id number]    Aug 03, 1946  Primary Care Physician:System, Pcp Not In  Referring Physician: Osvaldo Shipper, MD [address] [place],  [place] [address]  Chief complaint: Follow-up for post Covid 19 pneumonia  HPI: 74 year old ex-smoker with history of hypertension, emphysema, hyperlipidemia He was hospitalized on 11/25 for COVID-19 pneumonia, acute respiratory failure.  Diagnosed with pulmonary embolism. Treated with remdesivir, steroids and anticoagulation.  Discharge he states that his breathing is improving.  He was given albuterol for a few days on discharge but has stopped using it  Pets: 2 dogs.  No cats, birds, farm animals Occupation: Retired Airline pilot Exposures: Exposure to agent orange in [place].  Has feather pillows and comforters for several years.  No mold, hot tub, Jacuzzi Smoking history: 45-pack-year smoker.  Quit in 2000 Travel history: Lived in [place] for a few years.  No significant recent Relevant family history: No significant family history of lung disease  Outpatient Encounter Medications as of 10/09/2019  Medication Sig  . albuterol (VENTOLIN HFA) 108 (90 Base) MCG/ACT inhaler Inhale 2 puffs into the lungs every 6 (six) hours as needed for wheezing or shortness of breath.  Marland Kitchen amLODipine (NORVASC) 5 MG tablet Take 5 mg by mouth daily.    . Calcium Carbonate-Vitamin D (CALCIUM + D PO) Take by mouth daily.    . cholecalciferol (VITAMIN D3) 25 MCG (1000 UT) tablet Take 2,000 Units by mouth daily.  . fish oil-omega-3 fatty acids 1000 MG capsule Take 1 g by mouth daily.    Marland Kitchen levothyroxine (SYNTHROID) 137 MCG tablet Take 137 mcg by mouth daily before breakfast.  . lisinopril-hydrochlorothiazide (ZESTORETIC) 20-12.5 MG tablet Take 2 tablets by mouth daily.  . Multiple Vitamin (MULTIVITAMIN WITH MINERALS) TABS tablet Take 1 tablet by mouth daily.  . pantoprazole (PROTONIX) 40 MG tablet Take 40 mg by mouth daily.    .  rivaroxaban (XARELTO) 20 MG TABS tablet Take 1 tablet (20 mg total) by mouth daily with supper. Start only after you have completed the starter pack.  . [DISCONTINUED] chlorpheniramine-HYDROcodone (TUSSIONEX) 10-8 MG/5ML SUER Take 5 mLs by mouth every 12 (twelve) hours as needed for cough.  . [DISCONTINUED] dexamethasone (DECADRON) 2 MG tablet Take 2 tablets once daily for 3 days, then 1 tablet once daily for 3 days, then STOP.  . [DISCONTINUED] Rivaroxaban 15 & 20 MG TBPK Follow package directions: Take one 15mg  tablet by mouth twice a day. On day 22, switch to one 20mg  tablet once a day. Take with food.   No facility-administered encounter medications on file as of 10/09/2019.    Allergies as of 10/09/2019 - Review Complete 10/09/2019  Allergen Reaction Noted  . Morphine and related Nausea Only 10/11/2011    Past Medical History:  Diagnosis Date  . Acute renal failure (HCC)   . Carotid bruit   . Claudication (HCC)   . Duodenal stricture   . GERD (gastroesophageal reflux disease)   . Hiatal hernia   . Hypertension   . Hypothyroidism   . Leriche's syndrome (HCC)   . PVD (peripheral vascular disease) (HCC)    KNOWN AORTIC OCCLUSION  . Pyloric ulcer     Past Surgical History:  Procedure Laterality Date  . DILATION OF DUODENAL STRICTURE  09/08/09  . LUMBAR FUSION      Family History  Problem Relation Age of Onset  . Other Father        CHOKED  .  Other Mother        BLOOD POISONING  . Coronary artery disease Mother        CABG x5 LATE 70's  . Thyroid cancer Daughter        THYROIDECTOMY    Social History   Socioeconomic History  . Marital status: Married    Spouse name: CAROL  . Number of children: 3  . Years of education: Not on file  . Highest education level: Not on file  Occupational History  . Occupation: ACCOUNTANT  Tobacco Use  . Smoking status: Former Smoker    Packs/day: 1.00    Years: 45.00    Pack years: 45.00    Types: Cigarettes    Quit date: 2009     Years since quitting: 12.0  . Smokeless tobacco: Never Used  Substance and Sexual Activity  . Alcohol use: Not Currently  . Drug use: Never  . Sexual activity: Yes  Other Topics Concern  . Not on file  Social History Narrative  . Not on file   Social Determinants of Health   Financial Resource Strain:   . Difficulty of Paying Living Expenses: Not on file  Food Insecurity:   . Worried About Charity fundraiser in the Last Year: Not on file  . Ran Out of Food in the Last Year: Not on file  Transportation Needs:   . Lack of Transportation (Medical): Not on file  . Lack of Transportation (Non-Medical): Not on file  Physical Activity:   . Days of Exercise per Week: Not on file  . Minutes of Exercise per Session: Not on file  Stress:   . Feeling of Stress : Not on file  Social Connections:   . Frequency of Communication with Friends and Family: Not on file  . Frequency of Social Gatherings with Friends and Family: Not on file  . Attends Religious Services: Not on file  . Active Member of Clubs or Organizations: Not on file  . Attends Archivist Meetings: Not on file  . Marital Status: Not on file  Intimate Partner Violence:   . Fear of Current or Ex-Partner: Not on file  . Emotionally Abused: Not on file  . Physically Abused: Not on file  . Sexually Abused: Not on file    Review of systems: Review of Systems  Constitutional: Negative for fever and chills.  HENT: Negative.   Eyes: Negative for blurred vision.  Respiratory: as per HPI  Cardiovascular: Negative for chest pain and palpitations.  Gastrointestinal: Negative for vomiting, diarrhea, blood per rectum. Genitourinary: Negative for dysuria, urgency, frequency and hematuria.  Musculoskeletal: Negative for myalgias, back pain and joint pain.  Skin: Negative for itching and rash.  Neurological: Negative for dizziness, tremors, focal weakness, seizures and loss of consciousness.  Endo/Heme/Allergies:  Negative for environmental allergies.  Psychiatric/Behavioral: Negative for depression, suicidal ideas and hallucinations.  All other systems reviewed and are negative.  Physical Exam: Blood pressure 116/70, pulse 83, temperature (!) 97.5 F (36.4 C), temperature source Temporal, height 5\' 8"  (1.727 m), weight 175 lb (79.4 kg), SpO2 97 %. Gen:      No acute distress HEENT:  EOMI, sclera anicteric Neck:     No masses; no thyromegaly Lungs:    Clear to auscultation bilaterally; normal respiratory effort CV:         Regular rate and rhythm; no murmurs Abd:      + bowel sounds; soft, non-tender; no palpable masses, no distension Ext:  No edema; adequate peripheral perfusion Skin:      Warm and dry; no rash Neuro: alert and oriented x 3 Psych: normal mood and affect  Data Reviewed: Imaging: CTA 08/31/2019-bilateral pulmonary embolism with no right heart strain.  Large hiatal hernia, hepatic steatosis.  Bilateral lower lobe groundglass opacities consistent with COVID-19 infection I have reviewed the images personally.  Assessment:  Post COVID-19 Improving overall but has some residual symptoms We will get HRCT for better evaluation of the lung. CT will also allow [place] to assess him for hypersensitivity pneumonitis with ongoing feather exposure  Ex-smoker Schedule pulmonary function test for evaluation of COPD  Plan/Recommendations: -HRCT, PFTs  This appointment required 30 minutes of patient care (this includes precharting, chart review, review of results, face-to-face care, etc.).  Chilton Greathouse MD [facility] 10/09/2019, 10:37 AM  CC: Osvaldo Shipper, MD

## 2019-10-09 NOTE — Patient Instructions (Signed)
We will schedule you for pulmonary function test and high-resolution CT Follow-up in 2 to 4 weeks for review

## 2019-10-15 ENCOUNTER — Other Ambulatory Visit: Payer: Self-pay

## 2019-10-15 ENCOUNTER — Ambulatory Visit (INDEPENDENT_AMBULATORY_CARE_PROVIDER_SITE_OTHER)
Admission: RE | Admit: 2019-10-15 | Discharge: 2019-10-15 | Disposition: A | Discharge status: Home / Self Care | Payer: Medicare HMO | Source: Ambulatory Visit | Attending: Pulmonary Disease | Admitting: Pulmonary Disease

## 2019-10-15 DIAGNOSIS — R0602 Shortness of breath: Secondary | ICD-10-CM | POA: Diagnosis not present

## 2019-11-03 DIAGNOSIS — U071 COVID-19: Secondary | ICD-10-CM | POA: Diagnosis not present

## 2019-11-06 DIAGNOSIS — J8410 Pulmonary fibrosis, unspecified: Secondary | ICD-10-CM | POA: Diagnosis not present

## 2019-11-06 DIAGNOSIS — I739 Peripheral vascular disease, unspecified: Secondary | ICD-10-CM | POA: Diagnosis not present

## 2019-11-06 DIAGNOSIS — E7849 Other hyperlipidemia: Secondary | ICD-10-CM | POA: Diagnosis not present

## 2019-11-06 DIAGNOSIS — E038 Other specified hypothyroidism: Secondary | ICD-10-CM | POA: Diagnosis not present

## 2019-11-06 DIAGNOSIS — J449 Chronic obstructive pulmonary disease, unspecified: Secondary | ICD-10-CM | POA: Diagnosis not present

## 2019-11-06 DIAGNOSIS — M1990 Unspecified osteoarthritis, unspecified site: Secondary | ICD-10-CM | POA: Diagnosis not present

## 2019-11-06 DIAGNOSIS — Z1331 Encounter for screening for depression: Secondary | ICD-10-CM | POA: Diagnosis not present

## 2019-11-06 DIAGNOSIS — E781 Pure hyperglyceridemia: Secondary | ICD-10-CM | POA: Diagnosis not present

## 2019-11-06 DIAGNOSIS — I2699 Other pulmonary embolism without acute cor pulmonale: Secondary | ICD-10-CM | POA: Diagnosis not present

## 2019-11-12 DIAGNOSIS — E038 Other specified hypothyroidism: Secondary | ICD-10-CM | POA: Diagnosis not present

## 2019-11-12 DIAGNOSIS — Z125 Encounter for screening for malignant neoplasm of prostate: Secondary | ICD-10-CM | POA: Diagnosis not present

## 2019-11-12 DIAGNOSIS — E781 Pure hyperglyceridemia: Secondary | ICD-10-CM | POA: Diagnosis not present

## 2019-11-25 ENCOUNTER — Other Ambulatory Visit (HOSPITAL_COMMUNITY)
Admission: RE | Admit: 2019-11-25 | Discharge: 2019-11-25 | Disposition: A | Discharge status: Home / Self Care | Payer: Medicare HMO | Source: Ambulatory Visit | Attending: Pulmonary Disease | Admitting: Pulmonary Disease

## 2019-11-25 DIAGNOSIS — Z01812 Encounter for preprocedural laboratory examination: Secondary | ICD-10-CM | POA: Diagnosis not present

## 2019-11-25 DIAGNOSIS — Z20822 Contact with and (suspected) exposure to covid-19: Secondary | ICD-10-CM | POA: Insufficient documentation

## 2019-11-25 LAB — SARS CORONAVIRUS 2 (TAT 6-24 HRS): SARS Coronavirus 2: NEGATIVE

## 2019-11-28 ENCOUNTER — Other Ambulatory Visit: Payer: Self-pay

## 2019-11-28 ENCOUNTER — Ambulatory Visit: Payer: Medicare HMO | Admitting: Pulmonary Disease

## 2019-11-28 ENCOUNTER — Ambulatory Visit (INDEPENDENT_AMBULATORY_CARE_PROVIDER_SITE_OTHER): Payer: Medicare HMO | Admitting: Pulmonary Disease

## 2019-11-28 ENCOUNTER — Encounter: Payer: Self-pay | Admitting: Pulmonary Disease

## 2019-11-28 VITALS — BP 120/68 | HR 80 | Temp 97.1°F | Ht 68.0 in | Wt 176.8 lb

## 2019-11-28 DIAGNOSIS — U071 COVID-19: Secondary | ICD-10-CM

## 2019-11-28 DIAGNOSIS — Z87891 Personal history of nicotine dependence: Secondary | ICD-10-CM | POA: Diagnosis not present

## 2019-11-28 DIAGNOSIS — J849 Interstitial pulmonary disease, unspecified: Secondary | ICD-10-CM | POA: Diagnosis not present

## 2019-11-28 DIAGNOSIS — I2699 Other pulmonary embolism without acute cor pulmonale: Secondary | ICD-10-CM | POA: Diagnosis not present

## 2019-11-28 DIAGNOSIS — J449 Chronic obstructive pulmonary disease, unspecified: Secondary | ICD-10-CM | POA: Diagnosis not present

## 2019-11-28 DIAGNOSIS — R0602 Shortness of breath: Secondary | ICD-10-CM | POA: Diagnosis not present

## 2019-11-28 DIAGNOSIS — J1282 Pneumonia due to coronavirus disease 2019: Secondary | ICD-10-CM

## 2019-11-28 DIAGNOSIS — R918 Other nonspecific abnormal finding of lung field: Secondary | ICD-10-CM

## 2019-11-28 LAB — PULMONARY FUNCTION TEST
DL/VA % pred: 89 %
DL/VA: 3.6 ml/min/mmHg/L
DLCO cor % pred: 82 %
DLCO cor: 19.64 ml/min/mmHg
DLCO unc % pred: 82 %
DLCO unc: 19.64 ml/min/mmHg
FEF 25-75 Post: 2.35 L/sec
FEF 25-75 Pre: 1.61 L/sec
FEF2575-%Change-Post: 45 %
FEF2575-%Pred-Post: 109 %
FEF2575-%Pred-Pre: 75 %
FEV1-%Change-Post: 8 %
FEV1-%Pred-Post: 93 %
FEV1-%Pred-Pre: 86 %
FEV1-Post: 2.7 L
FEV1-Pre: 2.49 L
FEV1FVC-%Change-Post: 0 %
FEV1FVC-%Pred-Pre: 98 %
FEV6-%Change-Post: 5 %
FEV6-%Pred-Post: 97 %
FEV6-%Pred-Pre: 91 %
FEV6-Post: 3.63 L
FEV6-Pre: 3.43 L
FEV6FVC-%Change-Post: -2 %
FEV6FVC-%Pred-Post: 103 %
FEV6FVC-%Pred-Pre: 105 %
FVC-%Change-Post: 7 %
FVC-%Pred-Post: 93 %
FVC-%Pred-Pre: 86 %
FVC-Post: 3.74 L
FVC-Pre: 3.46 L
Post FEV1/FVC ratio: 72 %
Post FEV6/FVC ratio: 97 %
Pre FEV1/FVC ratio: 72 %
Pre FEV6/FVC Ratio: 99 %
RV % pred: 112 %
RV: 2.7 L
TLC % pred: 91 %
TLC: 6.09 L

## 2019-11-28 MED ORDER — ANORO ELLIPTA 62.5-25 MCG/INH IN AEPB: 1.0000 | INHALATION_SPRAY | Freq: Every day | RESPIRATORY_TRACT | 0 refills | Status: DC

## 2019-11-28 NOTE — Progress Notes (Signed)
PFT done today. 

## 2019-11-28 NOTE — Assessment & Plan Note (Signed)
Pulmonary function testing today shows COPD especially given emphysema on CT imaging and concavity on flow volume loops We will diagnosed patient has COPD Gold 1  Plan: Trial of Anoro Ellipta Walk today in office, no oxygen desaturations Follow-up with our office in 3 months

## 2019-11-28 NOTE — Assessment & Plan Note (Signed)
Plan: Repeat CT of chest in January/2021

## 2019-11-28 NOTE — Progress Notes (Signed)
@Patient  ID: , male    DOB: 06-Jun-1946, 74 y.o.   MRN: [id number]  Chief Complaint  Patient presents with  . Follow-up    F/U after PFT    Referring provider: No ref. provider found  HPI:  74 year old male former smoker initially referred to our office in January/2021 as a consult post COVID-19, suspected COPD.  Patient had a COVID-19 infection in November/2020 requiring hospitalization.  PMH: Hypertension, PVD, GERD Smoker/ Smoking History: Former Smoker.  Quit 2009.  45-pack-year smoking history. Maintenance: None Pt of: Dr. 2010  11/28/2019  - Visit   74 year old male former smoker presenting to office today after completing pulmonary function testing.  Patient was hospitalized in November/2020 with COVID-19.  He was found to have COVID-19 pneumonia.  He was discharged on 09/02/2019.  While hospitalized he did receive remdesivir and steroids.  He was also found to have an acute PE on CT scan.  Discharged on DOAC.  Patient was seen for initial consult with Dr. 14/10/2018 in January/2021.  At that point in time a high-resolution CT chest was ordered as well as pulmonary function testing.  Pulmonary function testing results are listed below:  11/28/2019-pulmonary function test-FVC 3.46 (86% predicted), postbronchodilator ratio 72, postbronchodilator FEV1 2.7 (93% predicted), no bronchodilator response, slight mid flow reversibility, DLCO 19.64 (82% predicted).  Concavity and flow volume loops  10/15/2019-CT chest high resolution-centrilobular emphysema, areas of previously seen pulmonary parenchymal groundglass in the peripheral mid and lower lung zones now appear more organized and associated bronchiectasis and architectural distortion, few scattered pulmonary nodules measure up to 5 mm, post Covid pulmonary fibrosis, findings are suggestive of alternative diagnosis not UIP per consensus guidelines, pulmonary nodules measure 5 mm or less in size, noncontrast chest CT can be  considered in 12 months, emphysema  Patient reports that overall he has been doing well since last being seen.  He continues to have aspects of shortness of breath.  He is still walking his dog 3 times a week averaging about 2 miles.  He does have some shortness of breath when walking uphill.  Patient tolerated walk in office today with no oxygen desaturations.  He does not currently have any maintenance inhalers.  Today was his first breathing test.  He is followed by the [facility] for primary care.  Questionaires / Pulmonary Flowsheets:  MMRC: mMRC Dyspnea Scale mMRC Score  11/28/2019 0  10/09/2019 1    Tests:   Imaging: CTA 08/31/2019-bilateral pulmonary embolism with no right heart strain.  Large hiatal hernia, hepatic steatosis.  Bilateral lower lobe groundglass opacities consistent with COVID-19 infection  10/15/2019-CT chest high resolution-centrilobular emphysema, areas of previously seen pulmonary parenchymal groundglass in the peripheral mid and lower lung zones now appear more organized and associated bronchiectasis and architectural distortion, few scattered pulmonary nodules measure up to 5 mm, post Covid pulmonary fibrosis, findings are suggestive of alternative diagnosis not UIP per consensus guidelines, pulmonary nodules measure 5 mm or less in size, noncontrast chest CT can be considered in 12 months, emphysema  11/28/2019-pulmonary function test-FVC 3.46 (86% predicted), postbronchodilator ratio 72, postbronchodilator FEV1 2.7 (93% predicted), no bronchodilator response, mid flow reversibility, DLCO 19.64 (82% predicted)  FENO:  No results found for: NITRICOXIDE  PFT: PFT Results Latest Ref Rng & Units 11/28/2019  FVC-Pre L 3.46  FVC-Predicted Pre % 86  FVC-Post L 3.74  FVC-Predicted Post % 93  Pre FEV1/FVC % % 72  Post FEV1/FCV % % 72  FEV1-Pre L 2.49  FEV1-Predicted  Pre % 86  FEV1-Post L 2.70  DLCO UNC% % 82  DLCO COR %Predicted % 89  TLC L 6.09  TLC % Predicted % 91   RV % Predicted % 112    WALK:  SIX MIN WALK 11/28/2019  Supplimental Oxygen during Test? (L/min) No  Tech Comments: Patient was able to walk at a steady pace for 2 laps. Denied any SOB, chest pain or leg pain. No O2 needed during or after walk.    Imaging: No results found.  Lab Results:  CBC    Component Value Date/Time   WBC 9.0 09/02/2019 0336   RBC 4.37 09/02/2019 0336   HGB 13.4 09/02/2019 0336   HCT 40.1 09/02/2019 0336   PLT 378 09/02/2019 0336   MCV 91.8 09/02/2019 0336   MCH 30.7 09/02/2019 0336   MCHC 33.4 09/02/2019 0336   RDW 13.2 09/02/2019 0336   LYMPHSABS 1.0 09/02/2019 0336   MONOABS 0.8 09/02/2019 0336   EOSABS 0.0 09/02/2019 0336   BASOSABS 0.0 09/02/2019 0336    BMET    Component Value Date/Time   NA 137 09/02/2019 0336   K 4.5 09/02/2019 0336   CL 103 09/02/2019 0336   CO2 25 09/02/2019 0336   GLUCOSE 141 (H) 09/02/2019 0336   BUN 31 (H) 09/02/2019 0336   CREATININE 0.92 09/02/2019 0336   CALCIUM 9.2 09/02/2019 0336   GFRNONAA >60 09/02/2019 0336   GFRAA >60 09/02/2019 0336    BNP No results found for: BNP  ProBNP No results found for: PROBNP  Specialty Problems      Pulmonary Problems   Pneumonia due to severe acute respiratory syndrome coronavirus 2 (SARS-CoV-2)   Acute respiratory failure with hypoxia (HCC)   COPD mixed type (HCC)   ILD (interstitial lung disease) ([place])    CTA 08/31/2019-bilateral pulmonary embolism with no right heart strain.  Large hiatal hernia, hepatic steatosis.  Bilateral lower lobe groundglass opacities consistent with COVID-19 infection  10/15/2019-CT chest high resolution-centrilobular emphysema, areas of previously seen pulmonary parenchymal groundglass in the peripheral mid and lower lung zones now appear more organized and associated bronchiectasis and architectural distortion, few scattered pulmonary nodules measure up to 5 mm, post Covid pulmonary fibrosis, findings are suggestive of alternative  diagnosis not UIP per consensus guidelines, pulmonary nodules measure 5 mm or less in size, noncontrast chest CT can be considered in 12 months, emphysema  11/28/2019-pulmonary function test-FVC 3.46 (86% predicted), postbronchodilator ratio 72, postbronchodilator FEV1 2.7 (93% predicted), no bronchodilator response, mid flow reversibility, DLCO 19.64 (82% predicted)         Allergies  Allergen Reactions  . Morphine And Related Nausea Only    Immunization History  Administered Date(s) Administered  . Influenza, High Dose Seasonal PF 06/09/2019  . Pneumococcal Polysaccharide-23 06/08/2016    Past Medical History:  Diagnosis Date  . Acute renal failure ([place])   . Carotid bruit   . Claudication ([place])   . Duodenal stricture   . GERD (gastroesophageal reflux disease)   . Hiatal hernia   . Hypertension   . Hypothyroidism   . Leriche's syndrome ([place])   . PVD (peripheral vascular disease) (HCC)    KNOWN AORTIC OCCLUSION  . Pyloric ulcer     Tobacco History: Social History   Tobacco Use  Smoking Status Former Smoker  . Packs/day: 1.00  . Years: 45.00  . Pack years: 45.00  . Types: Cigarettes  . Quit date: 2009  . Years since quitting: 12.1  Smokeless Tobacco Never  Used   Counseling given: Not Answered   Continue to not smoke  Outpatient Encounter Medications as of 11/28/2019  Medication Sig  . albuterol (VENTOLIN HFA) 108 (90 Base) MCG/ACT inhaler Inhale 2 puffs into the lungs every 6 (six) hours as needed for wheezing or shortness of breath.  Marland Kitchen amLODipine (NORVASC) 5 MG tablet Take 5 mg by mouth daily.    . Calcium Carbonate-Vitamin D (CALCIUM + D PO) Take by mouth daily.    . cholecalciferol (VITAMIN D3) 25 MCG (1000 UT) tablet Take 2,000 Units by mouth daily.  . fish oil-omega-3 fatty acids 1000 MG capsule Take 1 g by mouth daily.    Marland Kitchen levothyroxine (SYNTHROID) 137 MCG tablet Take 137 mcg by mouth daily before breakfast.  . lisinopril-hydrochlorothiazide  (ZESTORETIC) 20-12.5 MG tablet Take 2 tablets by mouth daily.  . Multiple Vitamin (MULTIVITAMIN WITH MINERALS) TABS tablet Take 1 tablet by mouth daily.  . pantoprazole (PROTONIX) 40 MG tablet Take 40 mg by mouth daily.    . rivaroxaban (XARELTO) 20 MG TABS tablet Take 1 tablet (20 mg total) by mouth daily with supper. Start only after you have completed the starter pack.  . umeclidinium-vilanterol (ANORO ELLIPTA) 62.5-25 MCG/INH AEPB Inhale 1 puff into the lungs daily.   No facility-administered encounter medications on file as of 11/28/2019.     Review of Systems  Review of Systems  Constitutional: Negative for activity change, chills, fatigue, fever and unexpected weight change.  HENT: Negative for postnasal drip, rhinorrhea, sinus pressure, sinus pain and sore throat.   Eyes: Negative.   Respiratory: Positive for shortness of breath. Negative for cough and wheezing.   Cardiovascular: Negative for chest pain, palpitations and leg swelling.  Gastrointestinal: Negative for constipation, diarrhea, nausea and vomiting.  Endocrine: Negative.   Genitourinary: Negative.   Musculoskeletal: Negative.   Skin: Negative.   Neurological: Negative for dizziness and headaches.  Psychiatric/Behavioral: Negative.  Negative for dysphoric mood. The patient is not nervous/anxious.   All other systems reviewed and are negative.    Physical Exam  BP 120/68 (BP Location: Left Arm, Patient Position: Sitting, Cuff Size: Normal)   Pulse 80   Temp (!) 97.1 F (36.2 C) (Temporal)   Ht 5\' 8"  (1.727 m)   Wt 176 lb 12.8 oz (80.2 kg)   SpO2 98% Comment: on RA  BMI 26.88 kg/m   Wt Readings from Last 5 Encounters:  11/28/19 176 lb 12.8 oz (80.2 kg)  10/09/19 175 lb (79.4 kg)  08/28/19 168 lb 3.4 oz (76.3 kg)    BMI Readings from Last 5 Encounters:  11/28/19 26.88 kg/m  10/09/19 26.61 kg/m  08/28/19 25.58 kg/m     Physical Exam Vitals and nursing note reviewed.  Constitutional:       General: He is not in acute distress.    Appearance: Normal appearance. He is normal weight.  HENT:     Head: Normocephalic and atraumatic.     Right Ear: Hearing, tympanic membrane, ear canal and external ear normal.     Left Ear: Hearing, tympanic membrane, ear canal and external ear normal.     Nose: Nose normal. No mucosal edema or rhinorrhea.     Right Turbinates: Not enlarged.     Left Turbinates: Not enlarged.     Mouth/Throat:     Mouth: Mucous membranes are dry.     Pharynx: Oropharynx is clear. No oropharyngeal exudate.  Eyes:     Pupils: Pupils are equal, round, and reactive to light.  Cardiovascular:     Rate and Rhythm: Normal rate and regular rhythm.     Pulses: Normal pulses.     Heart sounds: Normal heart sounds. No murmur.  Pulmonary:     Effort: Pulmonary effort is normal.     Breath sounds: No decreased breath sounds, wheezing or rales.  Musculoskeletal:     Cervical back: Normal range of motion.     Right lower leg: No edema.     Left lower leg: No edema.  Lymphadenopathy:     Cervical: No cervical adenopathy.  Skin:    General: Skin is warm and dry.     Capillary Refill: Capillary refill takes less than 2 seconds.     Findings: No erythema or rash.  Neurological:     General: No focal deficit present.     Mental Status: He is alert and oriented to person, place, and time.     Motor: No weakness.     Coordination: Coordination normal.     Gait: Gait is intact. Gait normal.  Psychiatric:        Mood and Affect: Mood normal.        Behavior: Behavior normal. Behavior is cooperative.        Thought Content: Thought content normal.        Judgment: Judgment normal.       Assessment & Plan:   Pulmonary embolism (HCC) Maintained on Xarelto Likely will need anticoagulation up until around May/2021  Plan: Continue Xarelto  COPD mixed type [facility]) Pulmonary function testing today shows COPD especially given emphysema on CT imaging and concavity on  flow volume loops We will diagnosed patient has COPD Gold 1  Plan: Trial of Anoro Ellipta Walk today in office, no oxygen desaturations Follow-up with our office in 3 months  ILD (interstitial lung disease) (HCC) Reassuring pulmonary function testing today Reviewed PFTs with patient today Reviewed findings of January/2021 CT chest high-res  Plan: Follow-up with our office in 3 months Spirometry with DLCO in 6 months May need to consider 6-minute walk if patient starts to become symptomatic, will defer at this point time as patient had walk in office without any oxygen desaturations We will repeat CT to follow nodules in 12 months  Abnormal findings on diagnostic imaging of lung Plan: Repeat CT of chest in January/2021  Former smoker Plan: Continue to not smoke We will repeat CT of chest to follow nodules in January/2022    Return in about 3 months (around 02/25/2020), or if symptoms worsen or fail to improve, for Follow up with Dr. Isaiah Serge.   Coral Ceo, NP 11/28/2019   This appointment required 45 minutes of patient care (this includes precharting, chart review, review of results, face-to-face care, etc.).

## 2019-11-28 NOTE — Progress Notes (Signed)
Patient seen in the office today and instructed on use of Anoro.  Patient expressed understanding and demonstrated technique.  Nicholas [name] 11/28/2019

## 2019-11-28 NOTE — Patient Instructions (Addendum)
You were seen today by Lauraine Rinne, NP  for:   It was nice meeting you.  We reviewed your pulmonary function testing.  I do believe that you have COPD given your emphysema as well as your numbers on your breathing test today.  We will give you a trial of a new inhaler called Anoro Ellipta.  Let us know how you are doing on this medication if you like it we can send in a prescription.  We will plan on repeating your breathing test again in around 6 months.  I will have you follow-up with Dr. Vaughan Browner in about 3 months.  Call us if you need anything or you have any questions.  It was nice meeting you,  Aaron Edelman  1. ILD (interstitial lung disease) ([place])  - Pulmonary function test; Future  Walk today in office was stable with no oxygen desaturations this is good news  We will repeat your breathing test in about 6 months  2. Pneumonia due to severe acute respiratory syndrome coronavirus 2 (SARS-CoV-2)  Stable, some scarring on recent high-resolution CT chest.  3. COPD mixed type (HCC)  Trial of Anoro Ellipta  >>> Take 1 puff daily in the morning right when you wake up >>>Rinse your mouth out after use >>>This is a daily maintenance inhaler, NOT a rescue inhaler >>>Contact our office if you are having difficulties affording or obtaining this medication >>>It is important for you to be able to take this daily and not miss any doses  Contact our office when you finish the samples and let us know how you are doing.  This should be in about 2 weeks.  You can also send [place] a MyChart message.  If you believe that the samples have helped we can place a prescription.  Only use your albuterol as a rescue medication to be used if you can't catch your breath by resting or doing a relaxed purse lip breathing pattern.  - The less you use it, the better it will work when you need it. - Ok to use up to 2 puffs  every 4 hours if you must but call for immediate appointment if use goes up over your usual need -  Don't leave home without it !!  (think of it like the spare tire for your car)      Note your daily symptoms > remember "red flags" for COPD:   >>>Increase in cough >>>increase in sputum production >>>increase in shortness of breath or activity  intolerance.   If you notice these symptoms, please call the office to be seen.    Continue to remain physically active  We would recommend the Covid vaccine for you   We recommend today:  Orders Placed This Encounter  Procedures  . Pulmonary function test    Standing Status:   Future    Standing Expiration Date:   11/27/2020    Scheduling Instructions:     Arlyce Harman with dlco      Schedule around 06/02/20    Order Specific Question:   Where should this test be performed?    Answer:   [facility]   Orders Placed This Encounter  Procedures  . Pulmonary function test   No orders of the defined types were placed in this encounter.   Follow Up:    Return in about 3 months (around 02/25/2020), or if symptoms worsen or fail to improve, for Follow up with Dr. Vaughan Browner.  Spirometry with DLCO needs to be scheduled  in 6 months around 06/02/2020  Please do your part to reduce the spread of COVID-19:      Reduce your risk of any infection  and COVID19 by using the similar precautions used for avoiding the common cold or flu:  Marland Kitchen Wash your hands often with soap and warm water for at least 20 seconds.  If soap and water are not readily available, use an alcohol-based hand sanitizer with at least 60% alcohol.  . If coughing or sneezing, cover your mouth and nose by coughing or sneezing into the elbow areas of your shirt or coat, into a tissue or into your sleeve (not your hands). Drinda Butts A MASK when in public  . Avoid shaking hands with others and consider head nods or verbal greetings only. . Avoid touching your eyes, nose, or mouth with unwashed hands.  . Avoid close contact with people who are sick. . Avoid places or events with large  numbers of people in one location, like concerts or sporting events. . If you have some symptoms but not all symptoms, continue to monitor at home and seek medical attention if your symptoms worsen. . If you are having a medical emergency, call 911.   ADDITIONAL HEALTHCARE OPTIONS FOR PATIENTS  [facility] / e-Visit: [email or web address]         [facility]: [phone]  [facility]: [phone]                   [facility]: [phone]     It is flu season:   >>> Best ways to protect herself from the flu: Receive the yearly flu vaccine, practice good hand hygiene washing with soap and also using hand sanitizer when available, eat a nutritious meals, get adequate rest, hydrate appropriately   Please contact the office if your symptoms worsen or you have concerns that you are not improving.   Thank you for choosing [facility] for your healthcare, and for allowing [place] to partner with you on your healthcare journey. I am thankful to be able to provide care to you today.   Elisha Headland FNP-C

## 2019-11-28 NOTE — Assessment & Plan Note (Signed)
Maintained on Xarelto Likely will need anticoagulation up until around May/2021  Plan: Continue Xarelto

## 2019-11-28 NOTE — Assessment & Plan Note (Signed)
Plan: Continue to not smoke We will repeat CT of chest to follow nodules in January/2022

## 2019-11-28 NOTE — Assessment & Plan Note (Signed)
Reassuring pulmonary function testing today Reviewed PFTs with patient today Reviewed findings of January/2021 CT chest high-res  Plan: Follow-up with our office in 3 months Spirometry with DLCO in 6 months May need to consider 6-minute walk if patient starts to become symptomatic, will defer at this point time as patient had walk in office without any oxygen desaturations We will repeat CT to follow nodules in 12 months

## 2019-12-24 DIAGNOSIS — J8410 Pulmonary fibrosis, unspecified: Secondary | ICD-10-CM | POA: Diagnosis not present

## 2019-12-24 DIAGNOSIS — I700 Atherosclerosis of aorta: Secondary | ICD-10-CM | POA: Diagnosis not present

## 2019-12-24 DIAGNOSIS — I2699 Other pulmonary embolism without acute cor pulmonale: Secondary | ICD-10-CM | POA: Diagnosis not present

## 2019-12-24 DIAGNOSIS — K315 Obstruction of duodenum: Secondary | ICD-10-CM | POA: Diagnosis not present

## 2019-12-24 DIAGNOSIS — Z0000 Encounter for general adult medical examination without abnormal findings: Secondary | ICD-10-CM | POA: Diagnosis not present

## 2019-12-24 DIAGNOSIS — J449 Chronic obstructive pulmonary disease, unspecified: Secondary | ICD-10-CM | POA: Diagnosis not present

## 2019-12-24 DIAGNOSIS — E781 Pure hyperglyceridemia: Secondary | ICD-10-CM | POA: Diagnosis not present

## 2019-12-24 DIAGNOSIS — I739 Peripheral vascular disease, unspecified: Secondary | ICD-10-CM | POA: Diagnosis not present

## 2019-12-26 ENCOUNTER — Other Ambulatory Visit: Payer: Self-pay | Admitting: Internal Medicine

## 2019-12-26 DIAGNOSIS — Z0000 Encounter for general adult medical examination without abnormal findings: Secondary | ICD-10-CM

## 2019-12-26 DIAGNOSIS — I7090 Unspecified atherosclerosis: Secondary | ICD-10-CM

## 2019-12-26 DIAGNOSIS — Z87891 Personal history of nicotine dependence: Secondary | ICD-10-CM

## 2019-12-30 DIAGNOSIS — M810 Age-related osteoporosis without current pathological fracture: Secondary | ICD-10-CM | POA: Diagnosis not present

## 2020-01-05 ENCOUNTER — Ambulatory Visit
Admission: RE | Admit: 2020-01-05 | Discharge: 2020-01-05 | Disposition: A | Discharge status: Home / Self Care | Payer: Medicare HMO | Source: Ambulatory Visit | Attending: Internal Medicine | Admitting: Internal Medicine

## 2020-01-05 DIAGNOSIS — Z87891 Personal history of nicotine dependence: Secondary | ICD-10-CM

## 2020-01-05 DIAGNOSIS — I7090 Unspecified atherosclerosis: Secondary | ICD-10-CM

## 2020-01-05 DIAGNOSIS — Z0000 Encounter for general adult medical examination without abnormal findings: Secondary | ICD-10-CM

## 2020-01-05 DIAGNOSIS — Z136 Encounter for screening for cardiovascular disorders: Secondary | ICD-10-CM | POA: Diagnosis not present

## 2020-01-08 ENCOUNTER — Encounter: Payer: Self-pay | Admitting: Internal Medicine

## 2020-01-08 NOTE — Progress Notes (Signed)
Repeat in 5 years.

## 2020-04-22 DIAGNOSIS — Z87891 Personal history of nicotine dependence: Secondary | ICD-10-CM | POA: Diagnosis not present

## 2020-04-22 DIAGNOSIS — I700 Atherosclerosis of aorta: Secondary | ICD-10-CM | POA: Diagnosis not present

## 2020-04-22 DIAGNOSIS — J8410 Pulmonary fibrosis, unspecified: Secondary | ICD-10-CM | POA: Diagnosis not present

## 2020-04-22 DIAGNOSIS — R911 Solitary pulmonary nodule: Secondary | ICD-10-CM | POA: Diagnosis not present

## 2020-04-22 DIAGNOSIS — M859 Disorder of bone density and structure, unspecified: Secondary | ICD-10-CM | POA: Diagnosis not present

## 2020-04-22 DIAGNOSIS — E663 Overweight: Secondary | ICD-10-CM | POA: Diagnosis not present

## 2020-04-22 DIAGNOSIS — D692 Other nonthrombocytopenic purpura: Secondary | ICD-10-CM | POA: Diagnosis not present

## 2020-04-22 DIAGNOSIS — I2699 Other pulmonary embolism without acute cor pulmonale: Secondary | ICD-10-CM | POA: Diagnosis not present

## 2020-04-22 DIAGNOSIS — I739 Peripheral vascular disease, unspecified: Secondary | ICD-10-CM | POA: Diagnosis not present

## 2020-04-23 IMAGING — US US ABDOMINAL AORTA SCREENING AAA
1 series · 11 of 11 positions shown · non-contrast
Comparison: None.

CLINICAL DATA: Former smoker, atherosclerosis

EXAM:
US ABDOMINAL AORTA MEDICARE SCREENING
TECHNIQUE: Ultrasound examination of the abdominal aorta was performed as a
screening evaluation for abdominal aortic aneurysm.

[Series 1: us abdominal aorta screening aaa · 0.28mm/px · 11 of 11 slices shown]
[im 1/11]
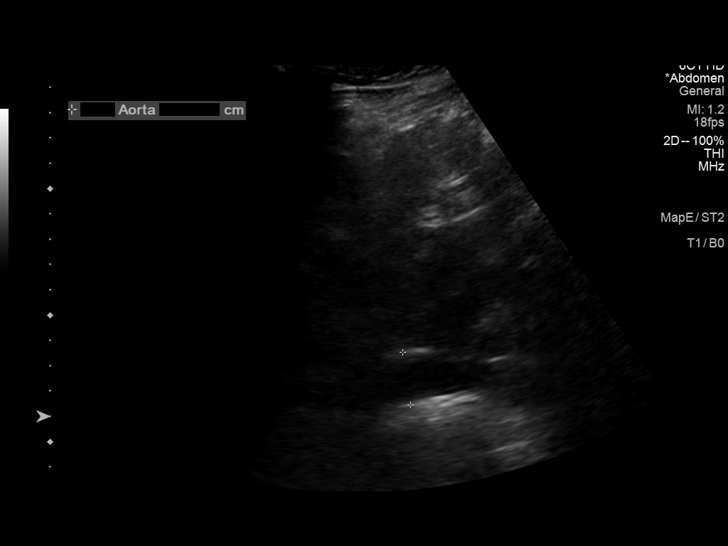
[im 2/11]
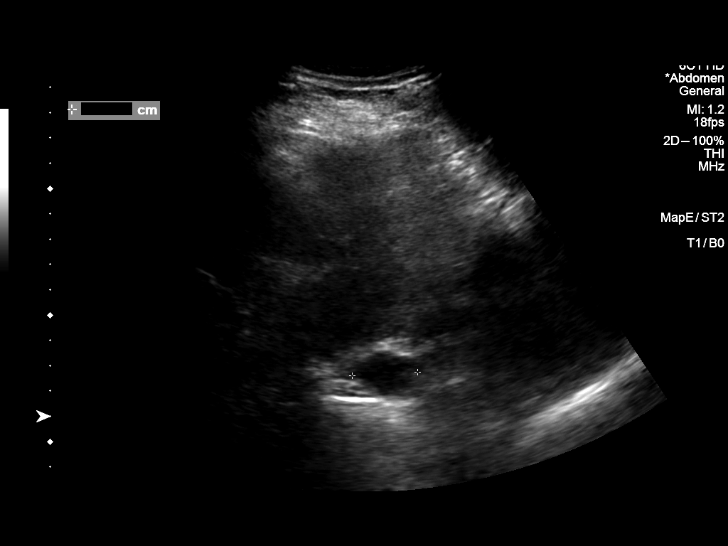
[im 3/11]
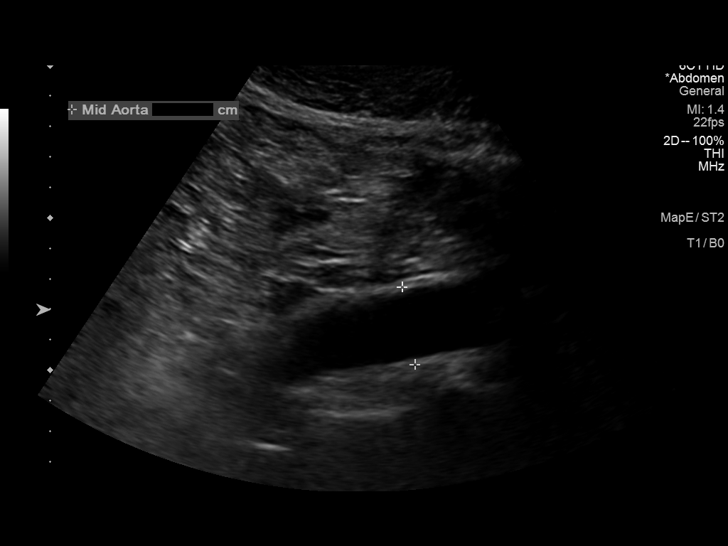
[im 4/11]
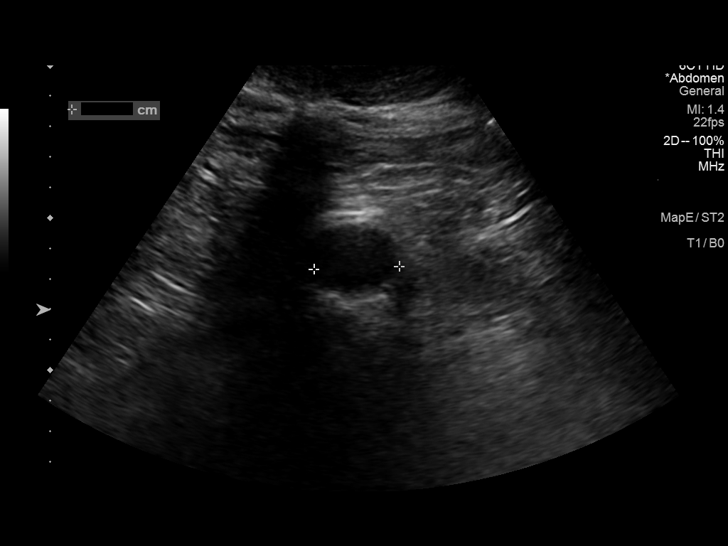
[im 5/11]
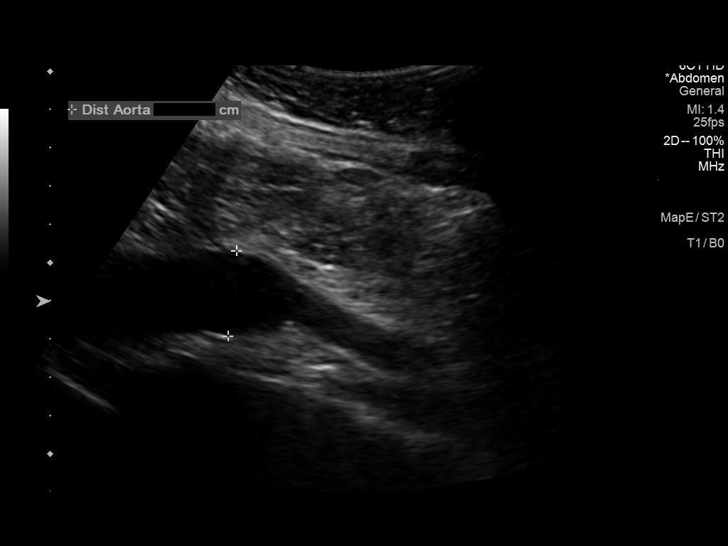
[im 6/11]
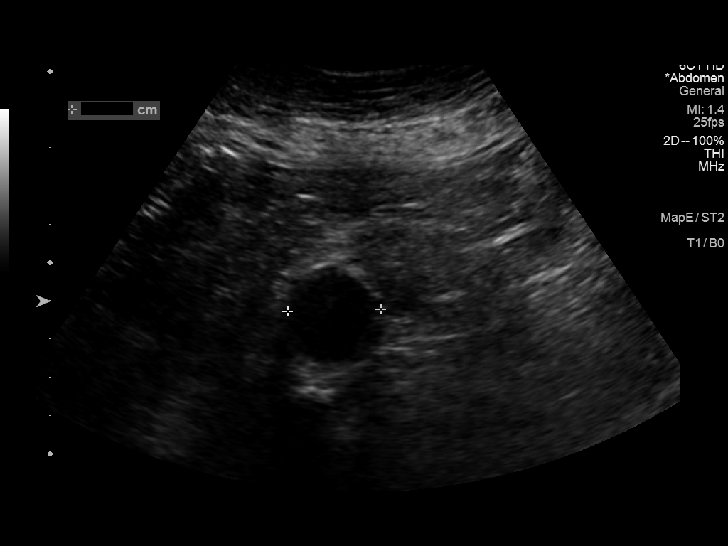
[im 7/11]
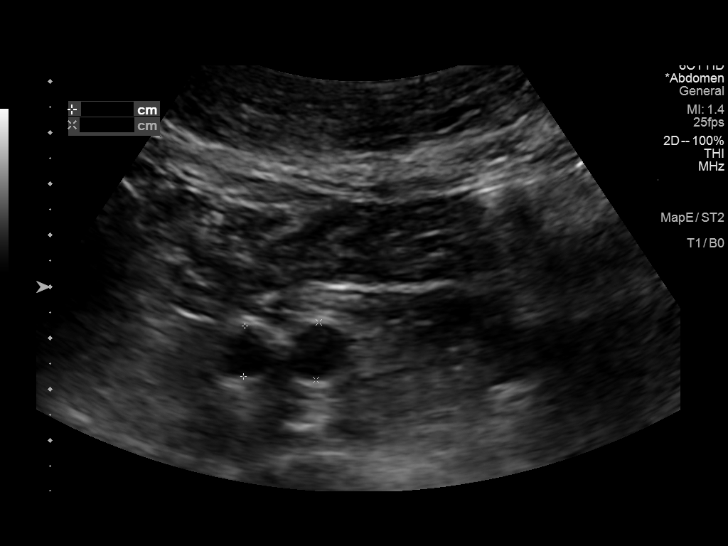
[im 8/11]
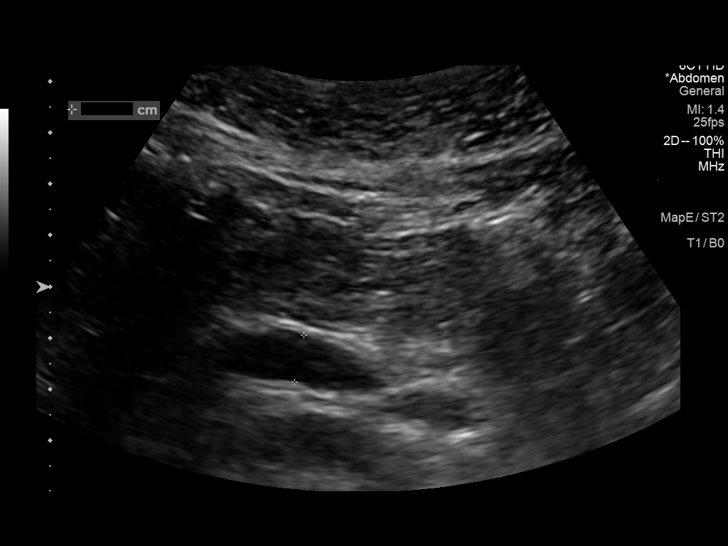
[im 9/11]
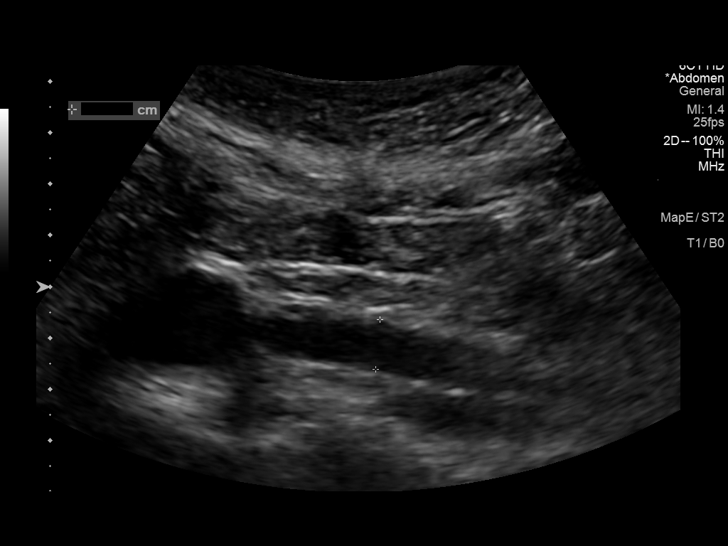
[im 10/11]
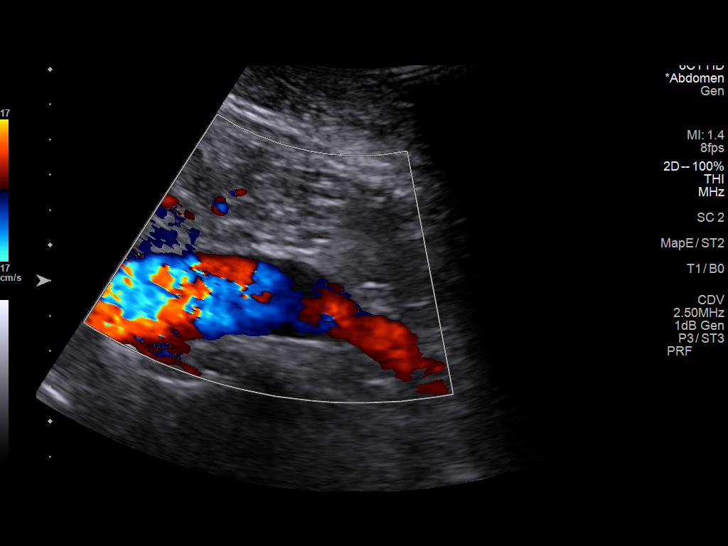
[im 11/11]
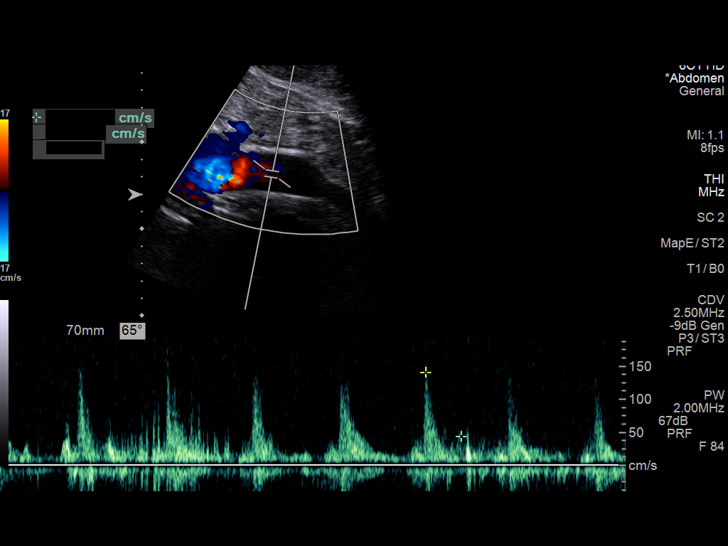

[11 of 11 positions shown; findings below may reference images not displayed]

FINDINGS: Abdominal aortic measurements as follows:

Proximal:  2.1 x 2.6 cm

Mid:  2.6 x 2.8 cm

Distal:  2.2 x 2.4 cm
IMPRESSION: Ectatic mid abdominal aorta measuring 2.6 x 2.8 cm at risk for
aneurysm development. Recommend followup by ultrasound in 5 years.
This recommendation follows ACR consensus guidelines: White Paper of
the ACR Incidental Findings Committee II on Vascular Findings. [HOSPITAL] 3055; [DATE].

Aortic aneurysm NOS (UXGQB-ST2.7)

## 2020-07-09 ENCOUNTER — Telehealth: Payer: Self-pay | Admitting: Adult Health

## 2020-07-09 ENCOUNTER — Ambulatory Visit (INDEPENDENT_AMBULATORY_CARE_PROVIDER_SITE_OTHER): Payer: Medicare HMO | Admitting: Pulmonary Disease

## 2020-07-09 ENCOUNTER — Ambulatory Visit: Payer: Medicare HMO | Admitting: Pulmonary Disease

## 2020-07-09 ENCOUNTER — Encounter: Payer: Self-pay | Admitting: Pulmonary Disease

## 2020-07-09 ENCOUNTER — Other Ambulatory Visit: Payer: Self-pay

## 2020-07-09 VITALS — BP 120/70 | HR 95 | Temp 97.6°F | Ht 68.0 in | Wt 172.8 lb

## 2020-07-09 DIAGNOSIS — R0602 Shortness of breath: Secondary | ICD-10-CM | POA: Diagnosis not present

## 2020-07-09 DIAGNOSIS — J449 Chronic obstructive pulmonary disease, unspecified: Secondary | ICD-10-CM

## 2020-07-09 DIAGNOSIS — J849 Interstitial pulmonary disease, unspecified: Secondary | ICD-10-CM | POA: Diagnosis not present

## 2020-07-09 LAB — PULMONARY FUNCTION TEST
DL/VA % pred: 92 %
DL/VA: 3.7 ml/min/mmHg/L
DLCO cor % pred: 87 %
DLCO cor: 20.71 ml/min/mmHg
DLCO unc % pred: 87 %
DLCO unc: 20.71 ml/min/mmHg
FEF 25-75 Pre: 1.98 L/sec
FEF2575-%Pred-Pre: 94 %
FEV1-%Pred-Pre: 93 %
FEV1-Pre: 2.67 L
FEV1FVC-%Pred-Pre: 101 %
FEV6-%Pred-Pre: 97 %
FEV6-Pre: 3.59 L
FEV6FVC-%Pred-Pre: 106 %
FVC-%Pred-Pre: 91 %
FVC-Pre: 3.62 L
Pre FEV1/FVC ratio: 74 %
Pre FEV6/FVC Ratio: 99 %

## 2020-07-09 NOTE — Patient Instructions (Signed)
Your breathing study is stable as we reviewed  You can stay off the blood thinner  Follow-up with Dr. Isaiah Serge in 6 months  Call with significant concerns  Make sure you take your flu shot

## 2020-07-09 NOTE — Telephone Encounter (Signed)
Patient is aware that 3:00 visit will be by telephone.  Patient voiced his understanding and had no further questions.  Nothing further needed.

## 2020-07-09 NOTE — Telephone Encounter (Signed)
Left detailed message for patient to let him know we will ask TP about the televisit.   Patient is currently scheduled for a PFT at 1pm and OV with TP at 3pm. TP, please advise if you would be ok with him doing a televisit. Thanks!

## 2020-07-09 NOTE — Telephone Encounter (Signed)
Lm for patient.  

## 2020-07-09 NOTE — Progress Notes (Signed)
Nicholas [name]    [id number]    30-Mar-1946  Primary Care Physician:Skakle, Eliberto Ivory, DO  Referring Physician: Charlane Ferretti, DO [address] [place],  [place] [address]  Chief complaint:   Feels relatively well today Had PFT done today  HPI:  No significant shortness of breath with normal activity but does get short of breath with exertion  History of pulmonary fibrosis History of COPD-emphysematous changes on his CT Covid infection November 2020  pulmonary embolism for which he was on Xarelto-stop taking Xarelto about 3 to 4 weeks ago  45-pack-year smoking history-quit in 2009  He feels relatively well  Outpatient Encounter Medications as of 07/09/2020  Medication Sig  . albuterol (VENTOLIN HFA) 108 (90 Base) MCG/ACT inhaler Inhale 2 puffs into the lungs every 6 (six) hours as needed for wheezing or shortness of breath.  Marland Kitchen amLODipine (NORVASC) 5 MG tablet Take 5 mg by mouth daily.    . Calcium Carbonate-Vitamin D (CALCIUM + D PO) Take by mouth daily.    . cholecalciferol (VITAMIN D3) 25 MCG (1000 UT) tablet Take 2,000 Units by mouth daily.  . fish oil-omega-3 fatty acids 1000 MG capsule Take 1 g by mouth daily.    Marland Kitchen levothyroxine (SYNTHROID) 137 MCG tablet Take 137 mcg by mouth daily before breakfast.  . lisinopril-hydrochlorothiazide (ZESTORETIC) 20-12.5 MG tablet Take 2 tablets by mouth daily.  . Multiple Vitamin (MULTIVITAMIN WITH MINERALS) TABS tablet Take 1 tablet by mouth daily.  . pantoprazole (PROTONIX) 40 MG tablet Take 40 mg by mouth daily.    . rivaroxaban (XARELTO) 20 MG TABS tablet Take 1 tablet (20 mg total) by mouth daily with supper. Start only after you have completed the starter pack.  . [DISCONTINUED] umeclidinium-vilanterol (ANORO ELLIPTA) 62.5-25 MCG/INH AEPB Inhale 1 puff into the lungs daily.   No facility-administered encounter medications on file as of 07/09/2020.    Allergies as of 07/09/2020 - Review Complete 07/09/2020  Allergen  Reaction Noted  . Morphine and related Nausea Only 10/11/2011    Past Medical History:  Diagnosis Date  . Acute renal failure (HCC)   . Carotid bruit   . Claudication (HCC)   . Duodenal stricture   . GERD (gastroesophageal reflux disease)   . Hiatal hernia   . Hypertension   . Hypothyroidism   . Leriche's syndrome (HCC)   . PVD (peripheral vascular disease) (HCC)    KNOWN AORTIC OCCLUSION  . Pyloric ulcer     Past Surgical History:  Procedure Laterality Date  . DILATION OF DUODENAL STRICTURE  09/08/09  . LUMBAR FUSION      Family History  Problem Relation Age of Onset  . Other Father        CHOKED  . Other Mother        BLOOD POISONING  . Coronary artery disease Mother        CABG x5 LATE 70's  . Thyroid cancer Daughter        THYROIDECTOMY    Social History   Socioeconomic History  . Marital status: Married    Spouse name: CAROL  . Number of children: 3  . Years of education: Not on file  . Highest education level: Not on file  Occupational History  . Occupation: ACCOUNTANT  Tobacco Use  . Smoking status: Former Smoker    Packs/day: 1.00    Years: 45.00    Pack years: 45.00    Types: Cigarettes  Quit date: 2009    Years since quitting: 12.7  . Smokeless tobacco: Never Used  Vaping Use  . Vaping Use: Never used  Substance and Sexual Activity  . Alcohol use: Not Currently  . Drug use: Never  . Sexual activity: Yes  Other Topics Concern  . Not on file  Social History Narrative  . Not on file   Social Determinants of Health   Financial Resource Strain:   . Difficulty of Paying Living Expenses: Not on file  Food Insecurity:   . Worried About Programme researcher, broadcasting/film/video in the Last Year: Not on file  . Ran Out of Food in the Last Year: Not on file  Transportation Needs:   . Lack of Transportation (Medical): Not on file  . Lack of Transportation (Non-Medical): Not on file  Physical Activity:   . Days of Exercise per Week: Not on file  . Minutes of  Exercise per Session: Not on file  Stress:   . Feeling of Stress : Not on file  Social Connections:   . Frequency of Communication with Friends and Family: Not on file  . Frequency of Social Gatherings with Friends and Family: Not on file  . Attends Religious Services: Not on file  . Active Member of Clubs or Organizations: Not on file  . Attends Banker Meetings: Not on file  . Marital Status: Not on file  Intimate Partner Violence:   . Fear of Current or Ex-Partner: Not on file  . Emotionally Abused: Not on file  . Physically Abused: Not on file  . Sexually Abused: Not on file    Review of Systems  Respiratory: Positive for shortness of breath.   All other systems reviewed and are negative.   Vitals:   07/09/20 1339  BP: 120/70  Pulse: 95  Temp: 97.6 F (36.4 C)  SpO2: 96%     Physical Exam Constitutional:      Appearance: Normal appearance.  HENT:     Mouth/Throat:     Pharynx: No oropharyngeal exudate or posterior oropharyngeal erythema.  Eyes:     General:        Right eye: No discharge.        Left eye: No discharge.  Cardiovascular:     Rate and Rhythm: Normal rate and regular rhythm.     Heart sounds: No murmur heard.  No friction rub.  Pulmonary:     Effort: No respiratory distress.     Breath sounds: No stridor. No wheezing or rhonchi.  Musculoskeletal:     Cervical back: No rigidity or tenderness.  Neurological:     Mental Status: He is alert.  Psychiatric:        Mood and Affect: Mood normal.      Data Reviewed: Pulmonary function test reviewed and compared with previous from [facility] CT scan of the chest reviewed with the patient showing emphysematous changes, areas of fibrosis  Assessment:  Pulmonary embolism -Stopped taking Xarelto about 3 to 4 weeks ago -Remains fine  Interstitial lung disease -Stable PFT -Does not appear to have any functional limitation  Obstructive lung  disease -Emphysema  Plan/Recommendations: Rescue inhaler use as needed  Encouraged to stay active  He may stay off Xarelto at present  Encouraged to get vaccinated against the flu  To follow-up with Dr. Isaiah Serge in 6 months   Virl Diamond MD [facility] 07/09/2020, 1:57 PM  CC: Charlane Ferretti, DO

## 2020-07-09 NOTE — Telephone Encounter (Signed)
pt returning a phone call. Pt okay with appt being changed to televisit. Nothing further needed.

## 2020-07-09 NOTE — Progress Notes (Signed)
Spirometry and DLCO completed today  ?

## 2020-07-09 NOTE — Telephone Encounter (Signed)
That is fine 

## 2020-07-12 DIAGNOSIS — H5203 Hypermetropia, bilateral: Secondary | ICD-10-CM | POA: Diagnosis not present

## 2020-07-12 DIAGNOSIS — H2513 Age-related nuclear cataract, bilateral: Secondary | ICD-10-CM | POA: Diagnosis not present

## 2020-07-12 DIAGNOSIS — H52203 Unspecified astigmatism, bilateral: Secondary | ICD-10-CM | POA: Diagnosis not present

## 2020-07-26 DIAGNOSIS — H903 Sensorineural hearing loss, bilateral: Secondary | ICD-10-CM | POA: Diagnosis not present

## 2020-08-19 DIAGNOSIS — H903 Sensorineural hearing loss, bilateral: Secondary | ICD-10-CM | POA: Diagnosis not present

## 2020-10-11 ENCOUNTER — Telehealth: Payer: Self-pay | Admitting: Adult Health

## 2020-10-11 NOTE — Telephone Encounter (Signed)
LMTCB

## 2020-10-14 ENCOUNTER — Other Ambulatory Visit: Payer: Medicare HMO

## 2021-01-20 ENCOUNTER — Ambulatory Visit: Payer: Medicare HMO | Admitting: Adult Health

## 2021-03-11 ENCOUNTER — Other Ambulatory Visit (HOSPITAL_COMMUNITY): Payer: Self-pay | Admitting: General Surgery

## 2021-03-11 ENCOUNTER — Other Ambulatory Visit: Payer: Self-pay | Admitting: General Surgery

## 2021-03-11 DIAGNOSIS — K449 Diaphragmatic hernia without obstruction or gangrene: Secondary | ICD-10-CM

## 2021-03-17 ENCOUNTER — Ambulatory Visit (HOSPITAL_COMMUNITY)
Admission: RE | Admit: 2021-03-17 | Discharge: 2021-03-17 | Disposition: A | Discharge status: Home / Self Care | Payer: No Typology Code available for payment source | Source: Ambulatory Visit | Attending: General Surgery | Admitting: General Surgery

## 2021-03-17 ENCOUNTER — Other Ambulatory Visit: Payer: Self-pay

## 2021-03-17 DIAGNOSIS — K449 Diaphragmatic hernia without obstruction or gangrene: Secondary | ICD-10-CM | POA: Diagnosis not present

## 2021-06-03 DIAGNOSIS — I10 Essential (primary) hypertension: Secondary | ICD-10-CM | POA: Diagnosis not present

## 2021-12-05 DIAGNOSIS — K045 Chronic apical periodontitis: Secondary | ICD-10-CM | POA: Diagnosis not present

## 2023-11-12 ENCOUNTER — Emergency Department (HOSPITAL_COMMUNITY): Payer: No Typology Code available for payment source

## 2023-11-12 ENCOUNTER — Emergency Department (HOSPITAL_COMMUNITY)
Admission: EM | Admit: 2023-11-12 | Discharge: 2023-11-12 | Disposition: A | Discharge status: Home / Self Care | Payer: No Typology Code available for payment source | Attending: Emergency Medicine | Admitting: Emergency Medicine

## 2023-11-12 ENCOUNTER — Other Ambulatory Visit: Payer: Self-pay

## 2023-11-12 DIAGNOSIS — R0789 Other chest pain: Secondary | ICD-10-CM | POA: Diagnosis present

## 2023-11-12 DIAGNOSIS — Z79899 Other long term (current) drug therapy: Secondary | ICD-10-CM | POA: Diagnosis not present

## 2023-11-12 DIAGNOSIS — I10 Essential (primary) hypertension: Secondary | ICD-10-CM | POA: Diagnosis not present

## 2023-11-12 LAB — TROPONIN I (HIGH SENSITIVITY)
Troponin I (High Sensitivity): 12 ng/L (ref <18)
Troponin I (High Sensitivity): 14 ng/L (ref <18)

## 2023-11-12 LAB — CBC
HCT: 37.8 % — ABNORMAL LOW (ref 39.0–52.0)
Hemoglobin: 12.2 g/dL — ABNORMAL LOW (ref 13.0–17.0)
MCH: 31.3 pg (ref 26.0–34.0)
MCHC: 32.3 g/dL (ref 30.0–36.0)
MCV: 96.9 fL (ref 80.0–100.0)
Platelets: 277 10*3/uL (ref 150–400)
RBC: 3.9 MIL/uL — ABNORMAL LOW (ref 4.22–5.81)
RDW: 12.7 % (ref 11.5–15.5)
WBC: 6.5 10*3/uL (ref 4.0–10.5)
nRBC: 0 % (ref 0.0–0.2)

## 2023-11-12 LAB — BASIC METABOLIC PANEL
Anion gap: 12 (ref 5–15)
BUN: 20 mg/dL (ref 8–23)
CO2: 23 mmol/L (ref 22–32)
Calcium: 9.7 mg/dL (ref 8.9–10.3)
Chloride: 108 mmol/L (ref 98–111)
Creatinine, Ser: 0.94 mg/dL (ref 0.61–1.24)
GFR, Estimated: >60 mL/min (ref >60)
Glucose, Bld: 109 mg/dL — ABNORMAL HIGH (ref 70–99)
Potassium: 4.4 mmol/L (ref 3.5–5.1)
Sodium: 143 mmol/L (ref 135–145)

## 2023-11-12 MED ORDER — ALUM & MAG HYDROXIDE-SIMETH 200-200-20 MG/5ML PO SUSP
30.0000 mL | Freq: Once | ORAL | Status: AC
  Administered 2023-11-12: 30 mL via ORAL
  Filled 2023-11-12: qty 30

## 2023-11-12 MED ORDER — FAMOTIDINE 20 MG PO TABS
40.0000 mg | ORAL_TABLET | Freq: Once | ORAL | Status: AC
  Administered 2023-11-12: 40 mg via ORAL
  Filled 2023-11-12: qty 2

## 2023-11-12 MED ORDER — FAMOTIDINE 20 MG PO TABS: 20.0000 mg | ORAL_TABLET | Freq: Two times a day (BID) | ORAL | 0 refills | Status: AC

## 2023-11-12 NOTE — ED Triage Notes (Signed)
 Pt here POV with reports of having central chest pain intermittently. States pain seems to come after eating.

## 2023-11-12 NOTE — ED Notes (Signed)
 Awaiting patient from lobby.

## 2023-11-12 NOTE — ED Provider Notes (Signed)
 [place] EMERGENCY DEPARTMENT AT [facility] Provider Note   CSN: [id number] Arrival date & time: 11/12/23  1227     History  Chief Complaint  Patient presents with   Chest Pain   Shortness of Breath    Nicholas [name] is a 77 y.o. male with history of hypertension, hiatal hernia repair, cardiac bypass, presenting to the ED with complaint of intermittent chest pains.  Patient reports he has been having substernal chest pain for the past 3 to 4 months.  He notices specifically after eating food, usually within 30 minutes to an hour.  He has also noted that after he eats and tries to do anything revolving exertion, including just simple walking, it will exacerbate his symptoms.  He is on omeprazole but no other PPI medications.  He denies any associated symptoms including shortness of breath, lightheadedness or diaphoresis, radiation into the arms.  He says it is a substernal burning pressure that radiates into his jaw, will typically last a few minutes and then abate.  He follows at the [place] typically.  He has not seen a cardiologist recently.  He is currently asymptomatic  HPI     Home Medications Prior to Admission medications   Medication Sig Start Date End Date Taking? Authorizing Provider  famotidine  (PEPCID ) 20 MG tablet Take 1 tablet (20 mg total) by mouth 2 (two) times daily. 11/12/23 12/12/23 Yes Aritha Huckeba, Janalyn Me, MD  albuterol  (VENTOLIN  HFA) 108 [id number] Base) MCG/ACT inhaler Inhale 2 puffs into the lungs every 6 (six) hours as needed for wheezing or shortness of breath. 09/02/19   Krishnan, Gokul, MD  amLODipine  (NORVASC ) 5 MG tablet Take 5 mg by mouth daily.      [provider]  Calcium Carbonate-Vitamin D  (CALCIUM + D PO) Take by mouth daily.      [provider]  cholecalciferol  (VITAMIN D3) 25 MCG (1000 UT) tablet Take 2,000 Units by mouth daily.    [provider]  fish oil-omega-3 fatty acids 1000 MG capsule Take 1 g by mouth daily.       [provider]  levothyroxine  (SYNTHROID ) 137 MCG tablet Take 137 mcg by mouth daily before breakfast.    [provider]  lisinopril-hydrochlorothiazide (ZESTORETIC) 20-12.5 MG tablet Take 2 tablets by mouth daily.    [provider]  Multiple Vitamin (MULTIVITAMIN WITH MINERALS) TABS tablet Take 1 tablet by mouth daily. 09/03/19   Krishnan, Gokul, MD  pantoprazole  (PROTONIX ) 40 MG tablet Take 40 mg by mouth daily.      [provider]  rivaroxaban  (XARELTO ) 20 MG TABS tablet Take 1 tablet (20 mg total) by mouth daily with supper. Start only after you have completed the starter pack. 09/23/19   Krishnan, Gokul, MD      Allergies    Morphine and codeine and Penicillins    Review of Systems   Review of Systems  Physical Exam Updated Vital Signs BP (!) 149/81 (BP Location: Right Arm)   Pulse 60   Temp 98.1 F (36.7 C) (Oral)   Resp 16   Ht 5\' 8"  (1.727 m)   Wt 74.8 kg   SpO2 100%   BMI 25.09 kg/m  Physical Exam Constitutional:      General: He is not in acute distress. HENT:     Head: Normocephalic and atraumatic.  Eyes:     Conjunctiva/sclera: Conjunctivae normal.     Pupils: Pupils are equal, round, and reactive to light.  Cardiovascular:  Rate and Rhythm: Normal rate and regular rhythm.  Pulmonary:     Effort: Pulmonary effort is normal. No respiratory distress.  Abdominal:     General: There is no distension.     Tenderness: There is no abdominal tenderness.  Skin:    General: Skin is warm and dry.  Neurological:     General: No focal deficit present.     Mental Status: He is alert. Mental status is at baseline.  Psychiatric:        Mood and Affect: Mood normal.        Behavior: Behavior normal.     ED Results / Procedures / Treatments   Labs (all labs ordered are listed, but only abnormal results are displayed) Labs Reviewed  BASIC METABOLIC PANEL - Abnormal; Notable for the following components:      Result Value    Glucose, Bld 109 (*)    All other components within normal limits  CBC - Abnormal; Notable for the following components:   RBC 3.90 (*)    Hemoglobin 12.2 (*)    HCT 37.8 (*)    All other components within normal limits  TROPONIN I (HIGH SENSITIVITY)  TROPONIN I (HIGH SENSITIVITY)    EKG EKG Interpretation Date/Time:  Monday November 12 2023 13:00:24 EST Ventricular Rate:  64 PR Interval:  202 QRS Duration:  110 QT Interval:  404 QTC Calculation: 416 R Axis:   -36  Text Interpretation: Normal sinus rhythm Left axis deviation Left ventricular hypertrophy ( R in aVL , Cornell product , Romhilt-Estes ) Abnormal ECG When compared with ECG of 27-Aug-2019 23:56, No significant change since last tracing Confirmed by Racheal Buddle [phone]) on 11/12/2023 1:01:55 PM  Radiology DG Chest 2 View Result Date: 11/12/2023 CLINICAL DATA:  Chest pain, emphysema EXAM: CHEST - 2 VIEW COMPARISON:  08/31/2019 FINDINGS: Frontal and lateral views of the chest demonstrate an unremarkable cardiac silhouette. No airspace disease, effusion, or pneumothorax. No acute bony abnormalities. IMPRESSION: 1. Stable chest, no acute process. Electronically Signed   By: Bobbye Burrow M.D.   On: 11/12/2023 15:16    Procedures Procedures    Medications Ordered in ED Medications  alum & mag hydroxide-simeth (MAALOX/MYLANTA) 200-200-20 MG/5ML suspension 30 mL (30 mLs Oral Given 11/12/23 1658)  famotidine  (PEPCID ) tablet 40 mg (40 mg Oral Given 11/12/23 1658)    ED Course/ Medical Decision Making/ A&P                                 Medical Decision Making Amount and/or Complexity of Data Reviewed Labs: ordered. Radiology: ordered.  Risk OTC drugs.   This patient presents to the Emergency Department with complaint of chest pain. This involves an extensive number of treatment options, and is a complaint that carries with it a high risk of complications and morbidity, given the patient's comorbidity, including  coronary history, hypertension.The differential diagnosis includes ACS vs Pneumothorax vs Reflux/Gastritis vs MSK pain vs Pneumonia vs other.  I felt PE was less likely given that no acute PE risk factors, intermittent symptoms, no hypoxia, have a low suspicion for acute PE.  I ordered, reviewed, and interpreted labs.  Pertinent results include no findings.  Delta troponin is negative I ordered medication GI cocktail for chest pain I ordered imaging studies which included x-ray of the chest I independently visualized and interpreted imaging which showed no emergent findings and the monitor tracing which showed sinus. I  agree with the radiologist interpretation  I personally reviewed the patients ECG which showed sinus rhythm with no acute ischemic findings  After the interventions stated above, I reevaluated the patient and found that they were stable  Based on the patient's clinical exam, vital signs, risk factors, and ED testing, I felt that the patient's overall risk of life-threatening emergency such as ACS, PE, sepsis, or infection was low.  At this time, I felt the patient's presentation was most clinically consistent with hiatal hernia or reflux gastritis, but explained to the patient that this evaluation was not a definitive diagnostic workup.  I discussed outpatient follow up with primary care provider, and provided specialist office number on the patient's discharge paper if a referral was deemed necessary.  Return precautions were discussed with the patient.  I felt the patient was clinically stable for discharge.  Because the patient has a coronary history as well as some cardiovascular risk factors, I do think a referral to cardiology be reasonable.  He is welcome to use her own referral service or the [place] service.  In the meantime, strict return precautions were given if he were to have worsening symptoms, particularly with any other associated symptoms such as lightheadedness,  dizziness, nausea.  We will also start him on GI medications as the most likely culprit is gastric in nature.         Final Clinical Impression(s) / ED Diagnoses Final diagnoses:  Chest pressure    Rx / DC Orders ED Discharge Orders          Ordered    Ambulatory referral to Cardiology        11/12/23 1711    famotidine  (PEPCID ) 20 MG tablet  2 times daily        11/12/23 1749              Arvilla Birmingham, MD 11/13/23 0000

## 2023-11-12 NOTE — ED Provider Triage Note (Signed)
 Emergency Medicine Provider Triage Evaluation Note  Nicholas [name] , a 78 y.o. male  was evaluated in triage.  Pt complains of chest pain 3-4 months, seems associated with food and after eating, burning in mid chest. Reports sometimes CP with stairs or walking around but he thinks this only happens if he has eaten before. Was started on PPI without improvement. Doctor sent him here. No recent cath, echo or cards apt reported by patient. Not currenlty having pain.   Review of Systems  Positive: CP Negative: SOB, abdominal pain   Physical Exam  BP 134/64 (BP Location: Right Arm)   Pulse 63   Temp 98.2 F (36.8 C)   Resp 16   SpO2 96%  Gen:   Awake, no distress   Resp:  Normal effort  MSK:   Moves extremities without difficulty  Other:    Medical Decision Making  Medically screening exam initiated at 1:35 PM.  Appropriate orders placed.  Nicholas [name] was informed that the remainder of the evaluation will be completed by another provider, this initial triage assessment does not replace that evaluation, and the importance of remaining in the ED until their evaluation is complete.     Nicholas Heron, PA-C 11/12/23 5784

## 2023-11-27 DIAGNOSIS — M160 Bilateral primary osteoarthritis of hip: Secondary | ICD-10-CM | POA: Diagnosis not present

## 2023-11-27 DIAGNOSIS — M5441 Lumbago with sciatica, right side: Secondary | ICD-10-CM | POA: Diagnosis not present

## 2023-11-30 ENCOUNTER — Other Ambulatory Visit: Payer: Self-pay | Admitting: Orthopedic Surgery

## 2023-11-30 DIAGNOSIS — M25551 Pain in right hip: Secondary | ICD-10-CM

## 2023-12-25 ENCOUNTER — Other Ambulatory Visit: Payer: No Typology Code available for payment source

## 2023-12-26 ENCOUNTER — Ambulatory Visit
Admission: RE | Admit: 2023-12-26 | Discharge: 2023-12-26 | Disposition: A | Discharge status: Home / Self Care | Source: Ambulatory Visit | Attending: Orthopedic Surgery | Admitting: Orthopedic Surgery

## 2023-12-26 DIAGNOSIS — M25551 Pain in right hip: Secondary | ICD-10-CM | POA: Diagnosis not present

## 2023-12-26 DIAGNOSIS — M1611 Unilateral primary osteoarthritis, right hip: Secondary | ICD-10-CM | POA: Diagnosis not present

## 2024-01-01 DIAGNOSIS — M1611 Unilateral primary osteoarthritis, right hip: Secondary | ICD-10-CM | POA: Diagnosis not present

## 2024-01-30 ENCOUNTER — Encounter (HOSPITAL_COMMUNITY): Payer: Self-pay

## 2024-01-30 ENCOUNTER — Telehealth (HOSPITAL_COMMUNITY): Payer: Self-pay

## 2024-01-30 NOTE — Telephone Encounter (Signed)
 Outside/paper referral received by Dr. Rawland Caddy from [facility]. Called pt to see if he is interested in the cardiac rehab program unable to reach pt LMTCB.  Mailed letter

## 2024-02-07 ENCOUNTER — Telehealth (HOSPITAL_COMMUNITY): Payer: Self-pay

## 2024-02-07 NOTE — Telephone Encounter (Signed)
 Pt returned CR phone call and stated he is interested in CR. Pt has [facility] and [facility]. Pt stated he would like to use his VA to cover CR. Adv pt he would need to contact the VA to have auth faxed to [facility],  patient verbalized understanding.  Adv pt once we receive VA auth, we will contact him for scheduling.

## 2024-10-05 ENCOUNTER — Emergency Department (HOSPITAL_BASED_OUTPATIENT_CLINIC_OR_DEPARTMENT_OTHER): Admitting: Radiology

## 2024-10-05 ENCOUNTER — Other Ambulatory Visit: Payer: Self-pay

## 2024-10-05 ENCOUNTER — Emergency Department (HOSPITAL_BASED_OUTPATIENT_CLINIC_OR_DEPARTMENT_OTHER)
Admission: EM | Admit: 2024-10-05 | Discharge: 2024-10-05 | Disposition: A | Discharge status: Home / Self Care | Source: Ambulatory Visit | Attending: Emergency Medicine | Admitting: Emergency Medicine

## 2024-10-05 ENCOUNTER — Emergency Department (HOSPITAL_BASED_OUTPATIENT_CLINIC_OR_DEPARTMENT_OTHER)

## 2024-10-05 DIAGNOSIS — J449 Chronic obstructive pulmonary disease, unspecified: Secondary | ICD-10-CM | POA: Insufficient documentation

## 2024-10-05 DIAGNOSIS — Z7901 Long term (current) use of anticoagulants: Secondary | ICD-10-CM | POA: Diagnosis not present

## 2024-10-05 DIAGNOSIS — Z79899 Other long term (current) drug therapy: Secondary | ICD-10-CM | POA: Insufficient documentation

## 2024-10-05 DIAGNOSIS — E119 Type 2 diabetes mellitus without complications: Secondary | ICD-10-CM | POA: Diagnosis not present

## 2024-10-05 DIAGNOSIS — E039 Hypothyroidism, unspecified: Secondary | ICD-10-CM | POA: Diagnosis not present

## 2024-10-05 DIAGNOSIS — I482 Chronic atrial fibrillation, unspecified: Secondary | ICD-10-CM | POA: Diagnosis not present

## 2024-10-05 DIAGNOSIS — Z951 Presence of aortocoronary bypass graft: Secondary | ICD-10-CM | POA: Diagnosis not present

## 2024-10-05 DIAGNOSIS — I10 Essential (primary) hypertension: Secondary | ICD-10-CM | POA: Insufficient documentation

## 2024-10-05 DIAGNOSIS — J019 Acute sinusitis, unspecified: Secondary | ICD-10-CM | POA: Insufficient documentation

## 2024-10-05 DIAGNOSIS — R519 Headache, unspecified: Secondary | ICD-10-CM | POA: Diagnosis present

## 2024-10-05 DIAGNOSIS — B9689 Other specified bacterial agents as the cause of diseases classified elsewhere: Secondary | ICD-10-CM | POA: Diagnosis not present

## 2024-10-05 LAB — CBC WITH DIFFERENTIAL/PLATELET
Abs Immature Granulocytes: 0.11 K/uL — ABNORMAL HIGH (ref 0.00–0.07)
Basophils Absolute: 0.1 K/uL (ref 0.0–0.1)
Basophils Relative: 1 %
Eosinophils Absolute: 0 K/uL (ref 0.0–0.5)
Eosinophils Relative: 0 %
HCT: 43.3 % (ref 39.0–52.0)
Hemoglobin: 14.6 g/dL (ref 13.0–17.0)
Immature Granulocytes: 1 %
Lymphocytes Relative: 17 %
Lymphs Abs: 1.8 K/uL (ref 0.7–4.0)
MCH: 31.9 pg (ref 26.0–34.0)
MCHC: 33.7 g/dL (ref 30.0–36.0)
MCV: 94.7 fL (ref 80.0–100.0)
Monocytes Absolute: 0.7 K/uL (ref 0.1–1.0)
Monocytes Relative: 6 %
Neutro Abs: 7.9 K/uL — ABNORMAL HIGH (ref 1.7–7.7)
Neutrophils Relative %: 75 %
Platelets: 337 K/uL (ref 150–400)
RBC: 4.57 MIL/uL (ref 4.22–5.81)
RDW: 13.6 % (ref 11.5–15.5)
WBC: 10.6 K/uL — ABNORMAL HIGH (ref 4.0–10.5)
nRBC: 0 % (ref 0.0–0.2)

## 2024-10-05 LAB — COMPREHENSIVE METABOLIC PANEL WITH GFR
ALT: 16 U/L (ref 0–44)
AST: 23 U/L (ref 15–41)
Albumin: 3.7 g/dL (ref 3.5–5.0)
Alkaline Phosphatase: 87 U/L (ref 38–126)
Anion gap: 15 (ref 5–15)
BUN: 14 mg/dL (ref 8–23)
CO2: 24 mmol/L (ref 22–32)
Calcium: 10.1 mg/dL (ref 8.9–10.3)
Chloride: 104 mmol/L (ref 98–111)
Creatinine, Ser: 0.95 mg/dL (ref 0.61–1.24)
GFR, Estimated: >60 mL/min
Glucose, Bld: 98 mg/dL (ref 70–99)
Potassium: 3.5 mmol/L (ref 3.5–5.1)
Sodium: 143 mmol/L (ref 135–145)
Total Bilirubin: 0.3 mg/dL (ref 0.0–1.2)
Total Protein: 7.8 g/dL (ref 6.5–8.1)

## 2024-10-05 LAB — TROPONIN T, HIGH SENSITIVITY
Troponin T High Sensitivity: 21 ng/L — ABNORMAL HIGH (ref 0–19)
Troponin T High Sensitivity: 20 ng/L — ABNORMAL HIGH (ref 0–19)

## 2024-10-05 MED ORDER — AMOXICILLIN-POT CLAVULANATE 875-125 MG PO TABS: 1.0000 | ORAL_TABLET | Freq: Two times a day (BID) | ORAL | 0 refills | Status: AC

## 2024-10-05 MED ORDER — ALBUTEROL SULFATE (2.5 MG/3ML) 0.083% IN NEBU
INHALATION_SOLUTION | RESPIRATORY_TRACT | Status: AC
  Filled 2024-10-05: qty 3

## 2024-10-05 MED ORDER — LOSARTAN POTASSIUM 25 MG PO TABS: 25.0000 mg | ORAL_TABLET | Freq: Every day | ORAL | 0 refills | Status: AC

## 2024-10-05 MED ORDER — ALBUTEROL SULFATE (2.5 MG/3ML) 0.083% IN NEBU
2.5000 mg | INHALATION_SOLUTION | Freq: Once | RESPIRATORY_TRACT | Status: AC
  Administered 2024-10-05: 2.5 mg via RESPIRATORY_TRACT

## 2024-10-05 NOTE — ED Triage Notes (Signed)
 Patient states he has had a viral illness for several days. Patient developed a pressure in his head so he took his BP and it was systolic of 170's at home. Hx of high BP/ compliant with medications per patient

## 2024-10-05 NOTE — Discharge Instructions (Addendum)
 Increase your losartan  to 75mg  from 50mg . To do this I have prescribed you 25mg  tablets to take in addition to your current 50mg  tablet.

## 2024-10-05 NOTE — ED Provider Notes (Signed)
 " [place] EMERGENCY DEPARTMENT AT [facility] Provider Note   CSN: [id number] Arrival date & time: 10/05/24  9075     Patient presents with: Hypertension   Nicholas [name] is a 79 y.o. male.  {Add pertinent medical, surgical, social history, OB history to HPI:32947} HPI     Started with cough, congestion for about 1 week Last few days noted when wake up in the morning has headache, pressure top of head, feeling like pressure high 169 BP This morning was 172/93 and came in  A little dyspnea, no chest pain  Cough is worse in the AM and then improves No fevers  Productive cough Losartan  50mg  daily   Past Medical History:  Diagnosis Date   Acute renal failure    Carotid bruit    Claudication    Duodenal stricture    GERD (gastroesophageal reflux disease)    Hiatal hernia    Hypertension    Hypothyroidism    Leriche's syndrome (HCC)    PVD (peripheral vascular disease)    KNOWN AORTIC OCCLUSION   Pyloric ulcer      Prior to Admission medications  Medication Sig Start Date End Date Taking? Authorizing Provider  albuterol  (VENTOLIN  HFA) 108 (90 Base) MCG/ACT inhaler Inhale 2 puffs into the lungs every 6 (six) hours as needed for wheezing or shortness of breath. 09/02/19   Verdene Purchase, MD  amLODipine  (NORVASC ) 5 MG tablet Take 5 mg by mouth daily.      [provider]  Calcium Carbonate-Vitamin D  (CALCIUM + D PO) Take by mouth daily.      [provider]  cholecalciferol  (VITAMIN D3) 25 MCG (1000 UT) tablet Take 2,000 Units by mouth daily.    [provider]  famotidine  (PEPCID ) 20 MG tablet Take 1 tablet (20 mg total) by mouth 2 (two) times daily. 11/12/23 12/12/23  Cottie Donnice PARAS, MD  fish oil-omega-3 fatty acids 1000 MG capsule Take 1 g by mouth daily.      [provider]  levothyroxine  (SYNTHROID ) 137 MCG tablet Take 137 mcg by mouth daily before breakfast.    [provider]  lisinopril-hydrochlorothiazide  (ZESTORETIC) 20-12.5 MG tablet Take 2 tablets by mouth daily.    [provider]  Multiple Vitamin (MULTIVITAMIN WITH MINERALS) TABS tablet Take 1 tablet by mouth daily. 09/03/19   Krishnan, Gokul, MD  pantoprazole  (PROTONIX ) 40 MG tablet Take 40 mg by mouth daily.      [provider]  rivaroxaban  (XARELTO ) 20 MG TABS tablet Take 1 tablet (20 mg total) by mouth daily with supper. Start only after you have completed the starter pack. 09/23/19   Krishnan, Gokul, MD    Allergies: Morphine and codeine and Penicillins    Review of Systems  Updated Vital Signs BP (!) 163/80   Pulse 60   Temp 97.8 F (36.6 C) (Oral)   Resp 20   SpO2 94%   Physical Exam  (all labs ordered are listed, but only abnormal results are displayed) Labs Reviewed - No data to display  EKG: None  Radiology: No results found.  {Document cardiac monitor, telemetry assessment procedure when appropriate:32947} Procedures   Medications Ordered in the ED  albuterol  (PROVENTIL ) (2.5 MG/3ML) 0.083% nebulizer solution (  Not Given 10/05/24 1015)  albuterol  (PROVENTIL ) (2.5 MG/3ML) 0.083% nebulizer solution 2.5 mg (2.5 mg Nebulization Given 10/05/24 1014)      {Click here for ABCD2, HEART and other calculators REFRESH Note before signing:1}  Medical Decision Making Amount and/or Complexity of Data Reviewed Labs: ordered. Radiology: ordered.  Risk Prescription drug management.   ***  {Document critical care time when appropriate  Document review of labs and clinical decision tools ie CHADS2VASC2, etc  Document your independent review of radiology images and any outside records  Document your discussion with family members, caretakers and with consultants  Document social determinants of health affecting pt's care  Document your decision making why or why not admission, treatments were needed:32947:::1}   Final diagnoses:  None    ED Discharge Orders      None        "

## 2024-10-06 NOTE — ED Provider Notes (Incomplete)
 " [facility] AT [facility] Provider Note   CSN: [id number] Arrival date & time: 10/05/24  9075     Patient presents with: Hypertension   Nicholas [name] is a 79 y.o. male.  {Add pertinent medical, surgical, social history, OB history to HPI:32947} HPI     Started with cough, congestion for about 1 week Last few days noted when wake up in the morning has headache, pressure top of head, feeling like pressure high 169 BP This morning was 172/93 and came in  A little dyspnea, no chest pain  Cough is worse in the AM and then improves No fevers  Productive cough Losartan  50mg  daily   Past Medical History:  Diagnosis Date   Acute renal failure    Carotid bruit    Claudication    Duodenal stricture    GERD (gastroesophageal reflux disease)    Hiatal hernia    Hypertension    Hypothyroidism    Leriche's syndrome (HCC)    PVD (peripheral vascular disease)    KNOWN AORTIC OCCLUSION   Pyloric ulcer      Prior to Admission medications  Medication Sig Start Date End Date Taking? Authorizing Provider  albuterol  (VENTOLIN  HFA) 108 (90 Base) MCG/ACT inhaler Inhale 2 puffs into the lungs every 6 (six) hours as needed for wheezing or shortness of breath. 09/02/19   Krishnan, Gokul, MD  amLODipine  (NORVASC ) 5 MG tablet Take 5 mg by mouth daily.      [provider]  Calcium Carbonate-Vitamin D  (CALCIUM + D PO) Take by mouth daily.      [provider]  cholecalciferol  (VITAMIN D3) 25 MCG (1000 UT) tablet Take 2,000 Units by mouth daily.    [provider]  famotidine  (PEPCID ) 20 MG tablet Take 1 tablet (20 mg total) by mouth 2 (two) times daily. 11/12/23 12/12/23  Cottie Donnice PARAS, MD  fish oil-omega-3 fatty acids 1000 MG capsule Take 1 g by mouth daily.      [provider]  levothyroxine  (SYNTHROID ) 137 MCG tablet Take 137 mcg by mouth daily before breakfast.    [provider]   lisinopril-hydrochlorothiazide (ZESTORETIC) 20-12.5 MG tablet Take 2 tablets by mouth daily.    [provider]  Multiple Vitamin (MULTIVITAMIN WITH MINERALS) TABS tablet Take 1 tablet by mouth daily. 09/03/19   Krishnan, Gokul, MD  pantoprazole  (PROTONIX ) 40 MG tablet Take 40 mg by mouth daily.      [provider]  rivaroxaban  (XARELTO ) 20 MG TABS tablet Take 1 tablet (20 mg total) by mouth daily with supper. Start only after you have completed the starter pack. 09/23/19   Krishnan, Gokul, MD    Allergies: Morphine and codeine and Penicillins    Review of Systems  Updated Vital Signs BP (!) 163/80   Pulse 60   Temp 97.8 F (36.6 C) (Oral)   Resp 20   SpO2 94%   Physical Exam Vitals and nursing note reviewed.  Constitutional:      General: He is not in acute distress.    Appearance: Normal appearance. He is well-developed. He is not ill-appearing or diaphoretic.  HENT:     Head: Normocephalic and atraumatic.  Eyes:     General: No visual field deficit.    Extraocular Movements: Extraocular movements intact.     Conjunctiva/sclera: Conjunctivae normal.     Pupils: Pupils are equal, round, and reactive to light.  Cardiovascular:     Rate and Rhythm: Normal rate and  regular rhythm.     Pulses: Normal pulses.     Heart sounds: Normal heart sounds. No murmur heard.    No friction rub. No gallop.  Pulmonary:     Effort: Pulmonary effort is normal. No respiratory distress.     Breath sounds: Normal breath sounds. No wheezing or rales.  Abdominal:     General: There is no distension.     Palpations: Abdomen is soft.     Tenderness: There is no abdominal tenderness. There is no guarding.  Musculoskeletal:        General: No swelling or tenderness.     Cervical back: Normal range of motion.  Skin:    General: Skin is warm and dry.     Findings: No erythema or rash.  Neurological:     General: No focal deficit present.     Mental Status: He is alert and  oriented to person, place, and time.     GCS: GCS eye subscore is 4. GCS verbal subscore is 5. GCS motor subscore is 6.     Cranial Nerves: No cranial nerve deficit, dysarthria or facial asymmetry.     Sensory: No sensory deficit.     Motor: No weakness or tremor.     Coordination: Coordination normal. Finger-Nose-Finger Test normal.     Gait: Gait normal.     (all labs ordered are listed, but only abnormal results are displayed) Labs Reviewed - No data to display  EKG: None  Radiology: No results found.  {Document cardiac monitor, telemetry assessment procedure when appropriate:32947} Procedures   Medications Ordered in the ED  albuterol  (PROVENTIL ) (2.5 MG/3ML) 0.083% nebulizer solution (  Not Given 10/05/24 1015)  albuterol  (PROVENTIL ) (2.5 MG/3ML) 0.083% nebulizer solution 2.5 mg (2.5 mg Nebulization Given 10/05/24 1014)      {Click here for ABCD2, HEART and other calculators REFRESH Note before signing:1}                              Medical Decision Making Amount and/or Complexity of Data Reviewed Labs: ordered. Radiology: ordered.  Risk Prescription drug management.   79 year old male with history of CABG 01/2024, postoperative course complicated by atrial fibrillation with RVR, anemia, right jubular DVT on eliquis,   {Document critical care time when appropriate  Document review of labs and clinical decision tools ie [id number], etc  Document your independent review of radiology images and any outside records  Document your discussion with family members, caretakers and with consultants  Document social determinants of health affecting pt's care  Document your decision making why or why not admission, treatments were needed:32947:::1}   Final diagnoses:  None    ED Discharge Orders     None        "
